# Patient Record
Sex: Male | Born: 1985 | Race: Black or African American | Hispanic: No | Marital: Single | Smoking: Current every day smoker
Health system: Southern US, Community
[De-identification: ages and names within clinical notes are randomized; demographics above are authoritative.]

## PROBLEM LIST (undated history)

## (undated) DIAGNOSIS — F909 Attention-deficit hyperactivity disorder, unspecified type: Secondary | ICD-10-CM

## (undated) DIAGNOSIS — B2790 Infectious mononucleosis, unspecified without complication: Secondary | ICD-10-CM

## (undated) DIAGNOSIS — L309 Dermatitis, unspecified: Secondary | ICD-10-CM

## (undated) DIAGNOSIS — E785 Hyperlipidemia, unspecified: Secondary | ICD-10-CM

## (undated) HISTORY — DX: Attention-deficit hyperactivity disorder, unspecified type: F90.9

## (undated) HISTORY — DX: Dermatitis, unspecified: L30.9

## (undated) HISTORY — DX: Hyperlipidemia, unspecified: E78.5

## (undated) HISTORY — DX: Infectious mononucleosis, unspecified without complication: B27.90

---
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
## 2008-10-13 ENCOUNTER — Encounter: Admission: RE | Admit: 2008-10-13 | Discharge: 2008-10-13 | Payer: Self-pay | Admitting: Cardiovascular Disease

## 2009-09-27 IMAGING — CR DG CHEST 2V
2 series · 2 of 2 positions shown · non-contrast
Comparison: None

CLINICAL DATA: Cough, asthma, smoker

CHEST - 2 VIEW

[w chest pa]
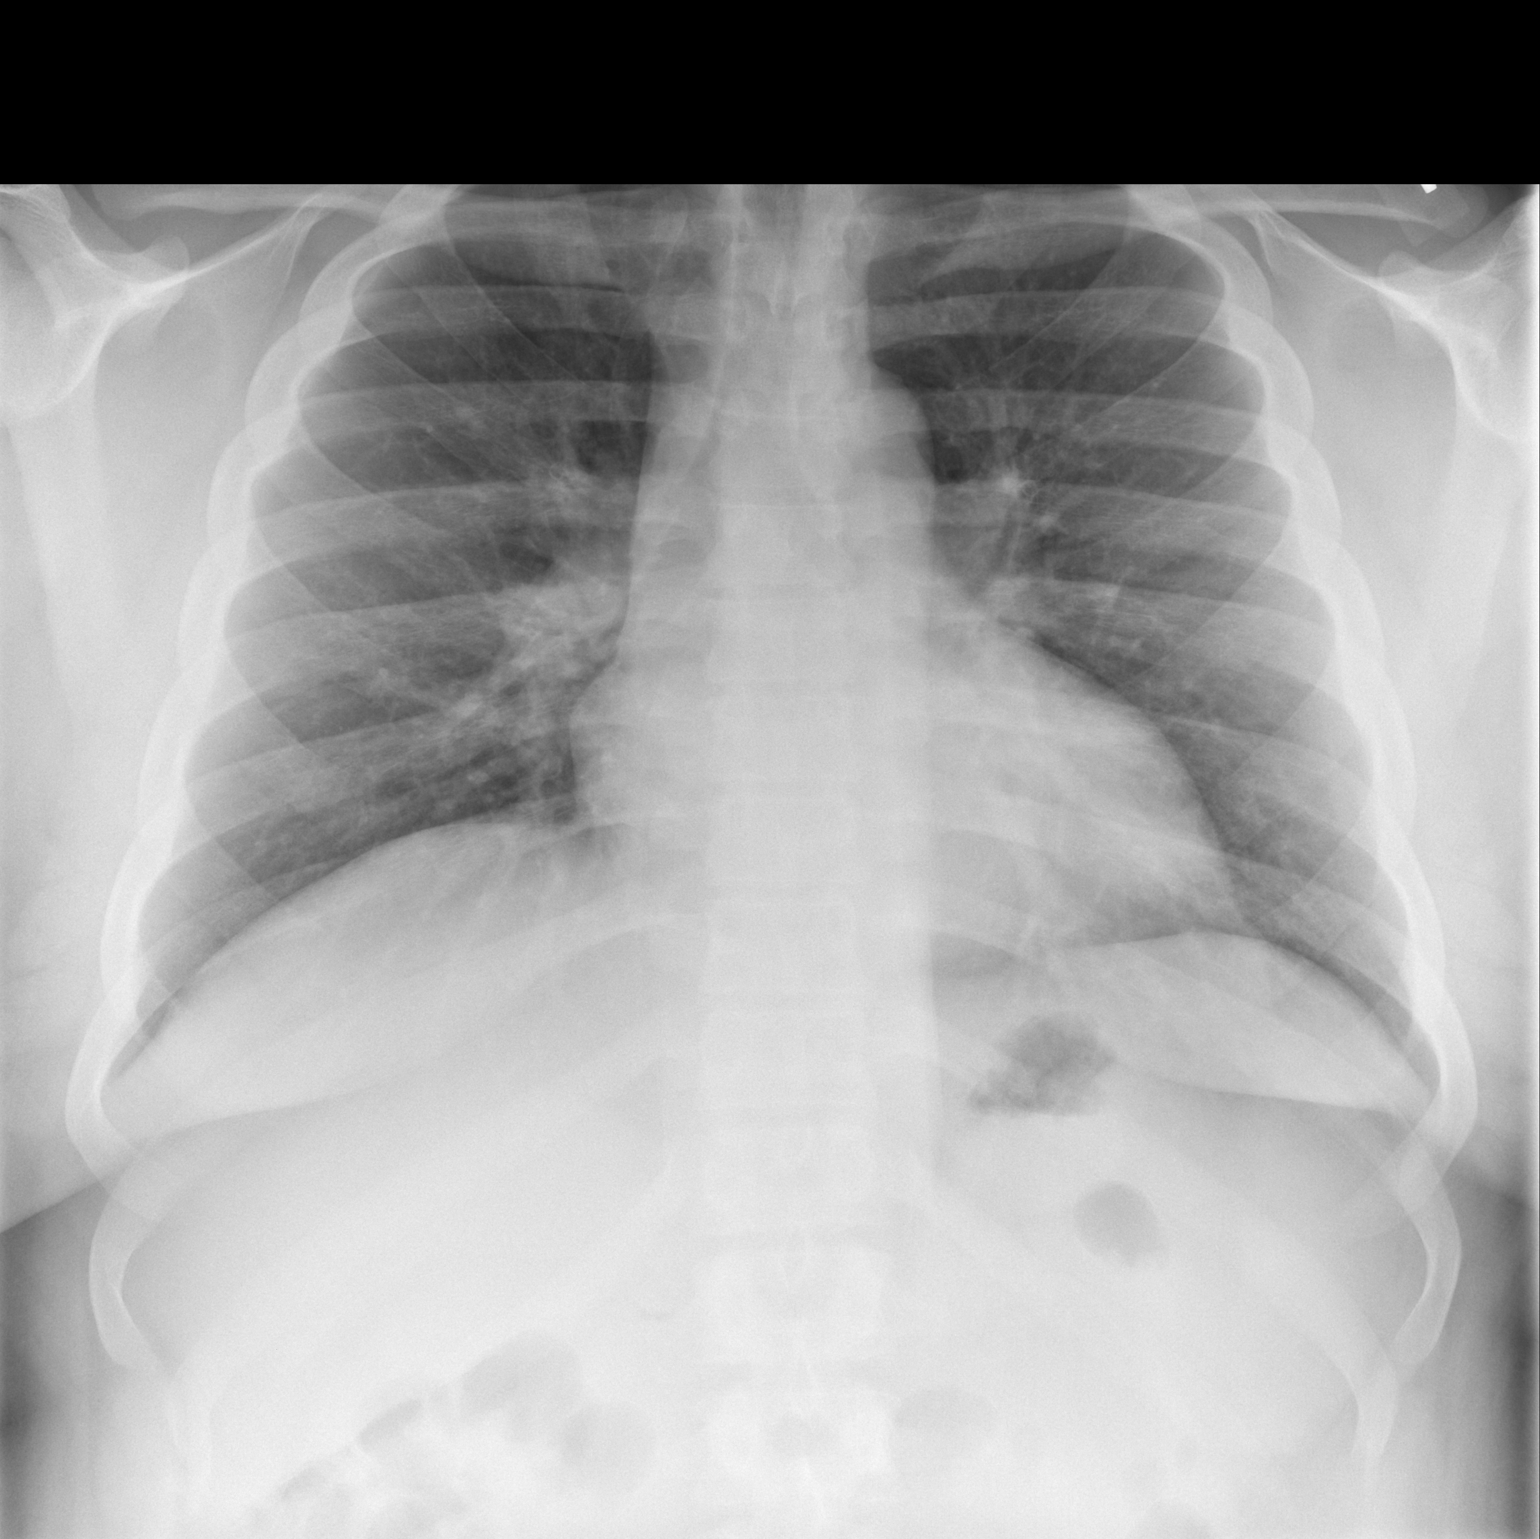

[w chest lat]
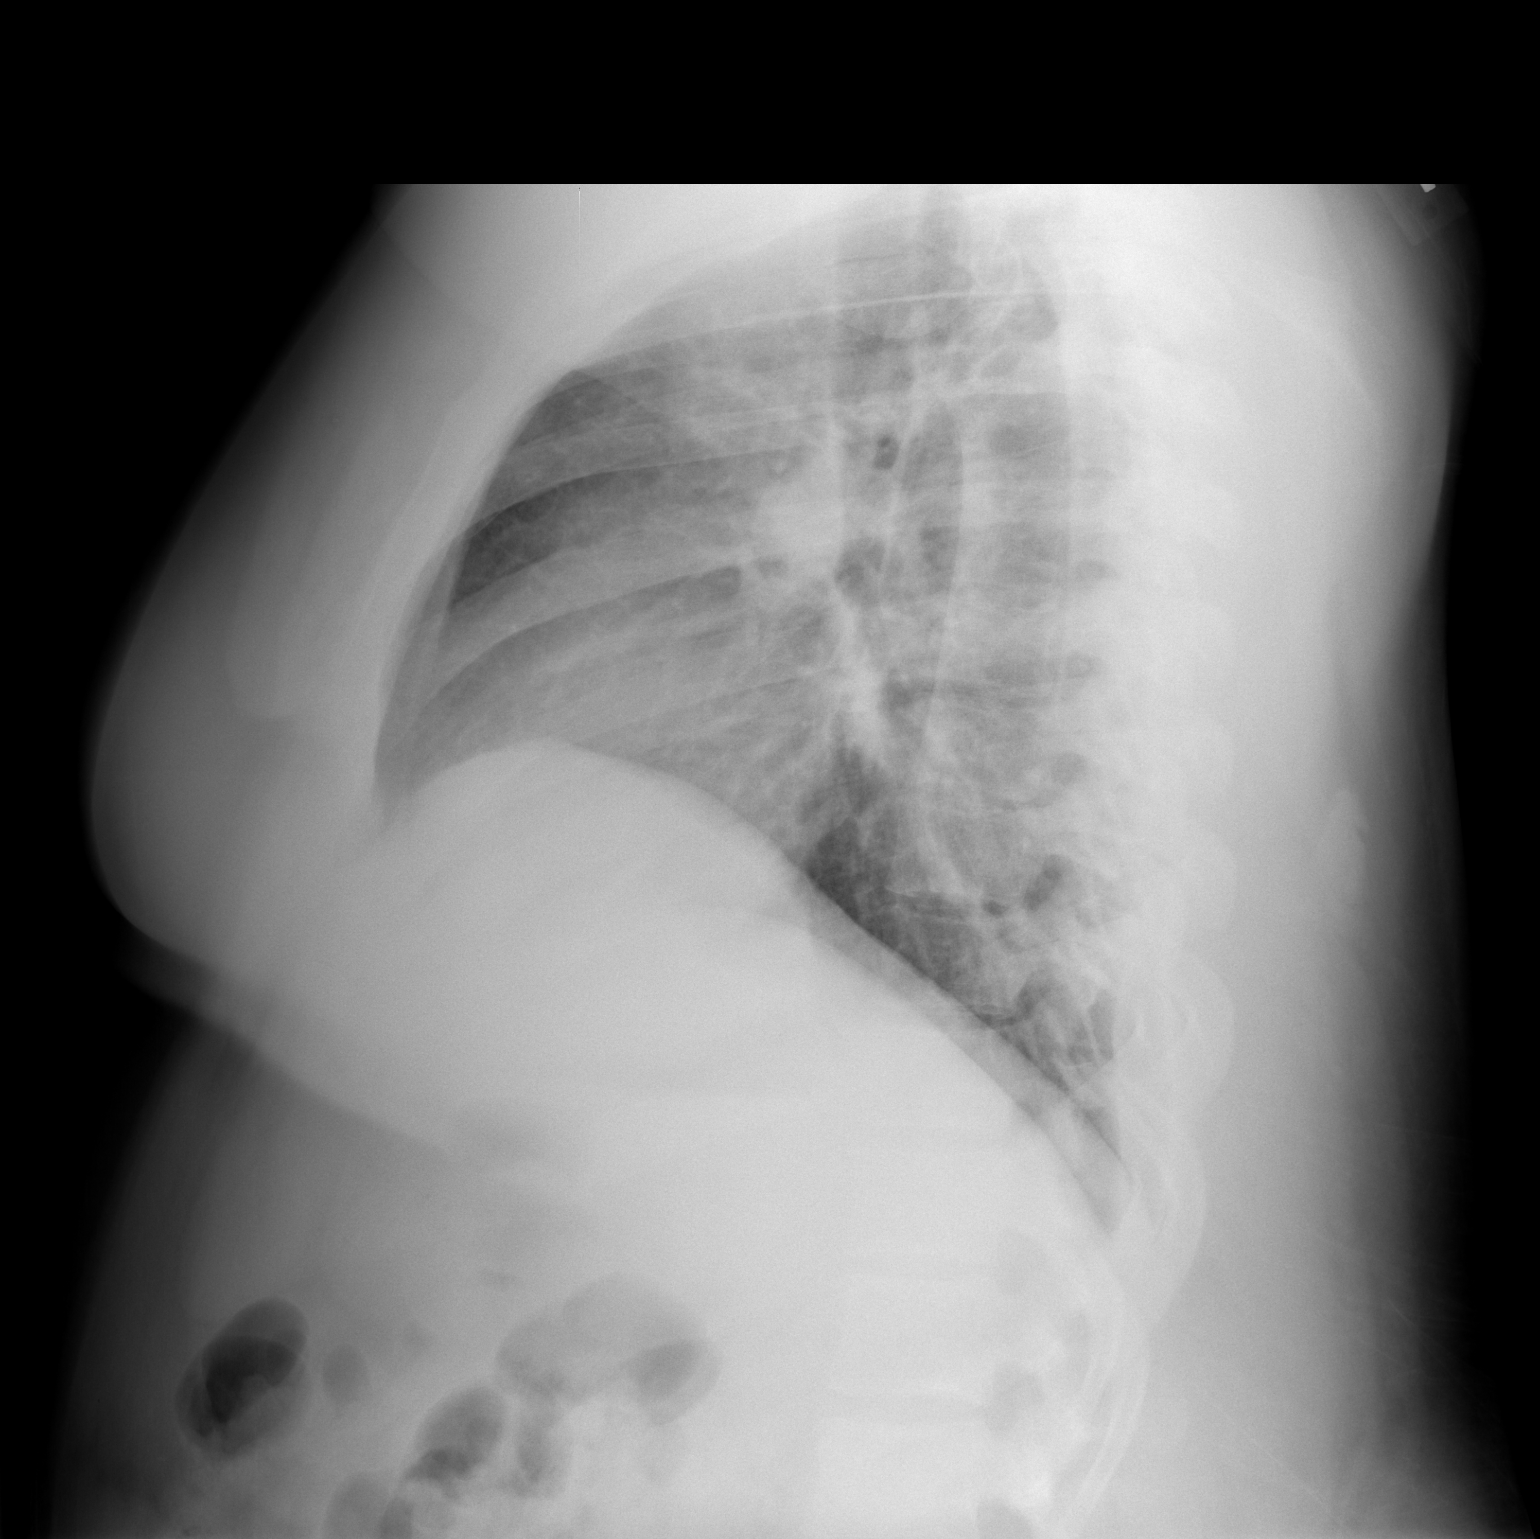

[2 of 2 positions shown; findings below may reference images not displayed]

FINDINGS: Borderline cardiac enlargement.
Normal mediastinal contours and pulmonary vascularity.
Peribronchial thickening without pulmonary infiltrate or pleural
effusion.
Bones unremarkable.
IMPRESSION: Borderline cardiac enlargement.
Mild bronchitic changes.

## 2011-04-03 ENCOUNTER — Other Ambulatory Visit: Payer: Self-pay

## 2011-04-03 DIAGNOSIS — Z111 Encounter for screening for respiratory tuberculosis: Secondary | ICD-10-CM

## 2011-04-03 MED ORDER — TUBERCULIN PPD 5 UNIT/0.1ML ID SOLN
5.0000 [IU] | Freq: Once | INTRADERMAL | Status: AC
  Administered 2011-04-03: 5 [IU] via INTRADERMAL

## 2011-04-04 ENCOUNTER — Encounter: Payer: Self-pay | Admitting: Family Medicine

## 2013-09-25 ENCOUNTER — Telehealth: Payer: Self-pay | Admitting: *Deleted

## 2013-09-25 ENCOUNTER — Other Ambulatory Visit: Payer: Self-pay

## 2013-09-25 DIAGNOSIS — E782 Mixed hyperlipidemia: Secondary | ICD-10-CM

## 2013-09-25 DIAGNOSIS — R011 Cardiac murmur, unspecified: Secondary | ICD-10-CM

## 2013-09-25 DIAGNOSIS — I10 Essential (primary) hypertension: Secondary | ICD-10-CM

## 2013-09-25 MED ORDER — AMLODIPINE BESYLATE 10 MG PO TABS: 10.0000 mg | ORAL_TABLET | Freq: Every day | ORAL | Status: DC

## 2013-09-25 NOTE — Telephone Encounter (Signed)
Rx was sent to pharmacy electronically. 

## 2013-09-25 NOTE — Telephone Encounter (Signed)
Spoke with Dr. Tresa EndoKelly in reference to patient's blood pressure being elevated 170/140. Patient had stopped his benicar for at least 1-1.5 years. On no therapy. Currently having no symptoms. Blood pressure was randomly checked. Patient unable to get into office right now due to scheduling conflicts. Per Dr. Tresa EndoKelly does not recommend he restart the Benicar due to not knowing patient's renal functions. Recommended patient to start amlodipine 10mg . Order blood work. Monitor blood pressure. Schedule appointment within the next few weeks.

## 2014-12-07 ENCOUNTER — Other Ambulatory Visit: Payer: Self-pay | Admitting: *Deleted

## 2014-12-07 MED ORDER — AMLODIPINE BESYLATE 10 MG PO TABS: 10.0000 mg | ORAL_TABLET | Freq: Every day | ORAL | Status: DC

## 2015-01-06 ENCOUNTER — Encounter: Payer: Self-pay | Admitting: *Deleted

## 2015-02-02 ENCOUNTER — Encounter: Payer: Self-pay | Admitting: Cardiovascular Disease

## 2016-01-19 MED FILL — HYDROXYZINE PAM 25 MG CAP: 25 | 25 days supply | Qty: 50 | Fill #0

## 2016-01-19 MED FILL — FLUTICASONE PROP 0.05% CRM: 0.05 | 20 days supply | Qty: 60 | Fill #0

## 2016-01-19 MED FILL — TRIAMCINOLONE 0.1% CREAM: 0.1 | 30 days supply | Qty: 454 | Fill #0

## 2016-01-19 MED FILL — MOMETASONE FUROATE 0.1% OIN: 0.1 | 20 days supply | Qty: 45 | Fill #0

## 2016-03-15 MED FILL — MOMETASONE FUROATE 0.1% OIN: 0.1 | 20 days supply | Qty: 45 | Fill #1

## 2016-03-15 MED FILL — TRIAMCINOLONE 0.1% CREAM: 0.1 | 30 days supply | Qty: 454 | Fill #1

## 2016-06-05 MED FILL — MOMETASONE FUROATE 0.1% OIN: 0.1 | 20 days supply | Qty: 45 | Fill #2

## 2016-08-25 MED FILL — MOMETASONE FUROATE 0.1% OIN: 0.1 | 20 days supply | Qty: 45 | Fill #3

## 2016-09-08 MED FILL — MOMETASONE FUROATE 0.1% OIN: 0.1 | 20 days supply | Qty: 45 | Fill #0

## 2016-11-16 MED FILL — MOMETASONE FUROATE 0.1% OIN: 0.1 | 20 days supply | Qty: 45 | Fill #1

## 2016-12-07 MED FILL — TRIAMCINOLONE 0.1% CREAM: 0.1 | 30 days supply | Qty: 454 | Fill #2

## 2016-12-07 MED FILL — MOMETASONE FUROATE 0.1% OIN: 0.1 | 20 days supply | Qty: 45 | Fill #2

## 2017-01-01 MED FILL — MOMETASONE FUROATE 0.1% OIN: 0.1 | 20 days supply | Qty: 45 | Fill #3

## 2017-03-12 MED FILL — CHLORHEXIDINE 0.12% RINSE: 0.12 | 7 days supply | Qty: 473 | Fill #0

## 2017-03-12 MED FILL — AMOX-CLAV 500-125 MG TABLET: 500-125 | 10 days supply | Qty: 30 | Fill #0

## 2017-03-12 MED FILL — SULFAMETHOXAZOLE-TMP DS TAB: 800-160 | 14 days supply | Qty: 28 | Fill #0

## 2017-03-12 MED FILL — AMLODIPINE BESYLATE 5 MG TA: 5 | 30 days supply | Qty: 30 | Fill #0

## 2019-08-06 ENCOUNTER — Encounter (INDEPENDENT_AMBULATORY_CARE_PROVIDER_SITE_OTHER): Payer: Self-pay

## 2019-08-06 ENCOUNTER — Other Ambulatory Visit: Payer: Self-pay

## 2019-08-06 ENCOUNTER — Ambulatory Visit (INDEPENDENT_AMBULATORY_CARE_PROVIDER_SITE_OTHER): Payer: BC Managed Care – PPO | Admitting: Cardiovascular Disease

## 2019-08-06 ENCOUNTER — Encounter: Payer: Self-pay | Admitting: Cardiovascular Disease

## 2019-08-06 DIAGNOSIS — I119 Hypertensive heart disease without heart failure: Secondary | ICD-10-CM

## 2019-08-06 DIAGNOSIS — G4733 Obstructive sleep apnea (adult) (pediatric): Secondary | ICD-10-CM

## 2019-08-06 DIAGNOSIS — I10 Essential (primary) hypertension: Secondary | ICD-10-CM

## 2019-08-06 MED ORDER — AMLODIPINE BESYLATE 10 MG PO TABS: 10.0000 mg | ORAL_TABLET | Freq: Every day | ORAL | 3 refills | Status: DC

## 2019-08-06 MED ORDER — METOPROLOL SUCCINATE ER 50 MG PO TB24: 50.0000 mg | ORAL_TABLET | Freq: Every day | ORAL | 1 refills | Status: DC

## 2019-08-06 NOTE — Progress Notes (Signed)
Cardiology Office Note    Date:  08/06/2019   ID:  ORESTE MAJEED, DOB 02-Jan-1986, MRN [id number]  PCP:  Ronnald Nian, MD  Cardiologist:  Nicki Guadalajara, MD   Re-establishment of cardiology care.  History of Present Illness:  ANTWOINE ZORN is a 34 y.o. male who I had seen many years ago he is in heart and vascular center.  I had initially seen him in February 2006.  He has a history of hypertension and obesity.  Mole he had been on Cardizem of therapy from almost a year prior to his initial evaluation with me.  When I first saw him in 2006 he weighed 275 pounds.  In 2008 he was 305 pounds.  An echo Doppler study done in 2008 showed mild to moderate left ventricular hypertrophy with wall thickness of 1.4 cm and left atrial dimension at 4.1 cm.  I last saw him in March 2010 and his weight was 341 pounds.  At that time he was on Advair twice daily for intermittent asthma.  During his last evaluation in March 2010 I was concerned about obstructive sleep apnea.  He had a poor diet with significant fast food intake as well as sweets.  I recommended he undergo a diagnostic polysomnogram and also provided him with samples of olmesartan to initiate blood pressure lowering medication.  I have not seen him over the past 11 years.  Apparently, at one point he may have been given a prescription by Dr. Algie Coffer for amlodipine but apparently stopped therapy.  He states his weight peaked at 380 pounds approximately over the past 3 years he had lost a significant amount of weight when he was doing significant amount of exercise.  He had been off blood pressure medications for over several years.  Blood pressure in the past had risen to 200/120.  He resume meds but apparently has been off medications for the past year.  With recent documentation of significant blood pressure elevation he resumed taking amlodipine for the past month at 5 mg.  He does exercise but not as much as he had in the past.  He currently  works as a Art therapist at [employer].  His sleep is poor.  Typically he goes to bed at midnight but may be up after 3 or 4 hours of sleep.  He admits to snoring.  He does not fall asleep during the day.  There is significant family history for obstructive sleep apnea in both his mother and older brother.  He never pursued my previous recommendation to have a sleep study over 10 years ago.  He has been trying to improve his diet and has not been eating fast foods as much as he had remotely.  Due to recent blood pressure elevation he now presents for reevaluation.   Past Medical History:  Diagnosis Date  . ADHD (attention deficit hyperactivity disorder)   . Asthma   . Dyslipidemia    MILD  . Eczema 1992  . Mononucleosis 05/02/2001     Current Medications: Outpatient Medications Prior to Visit  Medication Sig Dispense Refill  . amLODipine (NORVASC) 10 MG tablet Take 1 tablet (10 mg total) by mouth daily. (Patient taking differently: Take 5 mg by mouth daily. ) 30 tablet 6  . mometasone (ELOCON) 0.1 % ointment Apply topically as needed.    . diltiazem (CARDIZEM LA) 180 MG 24 hr tablet Take 180 mg by mouth daily.    . Fluticasone-Salmeterol (ADVAIR) 250-50 MCG/DOSE AEPB  Inhale 1 puff into the lungs as needed.    Marland Kitchen olmesartan (BENICAR) 20 MG tablet Take 20 mg by mouth daily.     No facility-administered medications prior to visit.     Allergies:   Peanuts [peanut oil]   Social History   Socioeconomic History  . Marital status: Single    Spouse name: Not on file  . Number of children: Not on file  . Years of education: Not on file  . Highest education level: Not on file  Occupational History  . Not on file  Tobacco Use  . Smoking status: Current Every Day Smoker  . Smokeless tobacco: Never Used  Substance and Sexual Activity  . Alcohol use: No  . Drug use: Never  . Sexual activity: Not on file  Other Topics Concern  . Not on file  Social History Narrative  . Not on file     Social Determinants of Health   Financial Resource Strain:   . Difficulty of Paying Living Expenses: Not on file  Food Insecurity:   . Worried About Programme researcher, broadcasting/film/video in the Last Year: Not on file  . Ran Out of Food in the Last Year: Not on file  Transportation Needs:   . Lack of Transportation (Medical): Not on file  . Lack of Transportation (Non-Medical): Not on file  Physical Activity:   . Days of Exercise per Week: Not on file  . Minutes of Exercise per Session: Not on file  Stress:   . Feeling of Stress : Not on file  Social Connections:   . Frequency of Communication with Friends and Family: Not on file  . Frequency of Social Gatherings with Friends and Family: Not on file  . Attends Religious Services: Not on file  . Active Member of Clubs or Organizations: Not on file  . Attends Banker Meetings: Not on file  . Marital Status: Not on file     Social history is that he is married since 2014.  He has a daughter who is now 40 from a previous girlfriend and a son age 55 with his current wife.  He is the son of our sleep nurse coordinator, Claiborne Rigg.  He currently is Art therapist at [employer].  Family History:  The patient's family history includes CAD in his paternal grandfather and paternal grandmother; COPD in his maternal grandfather; Diabetes in his paternal grandfather and paternal grandmother; Emphysema in his maternal grandfather; Fibromyalgia in his mother; Glaucoma in his maternal grandmother; Hyperlipidemia in his father and maternal grandmother; Hypertension in his maternal grandfather, maternal grandmother, mother, paternal grandfather, and paternal grandmother; Renal Disease in his paternal grandmother; Sarcoidosis in his maternal grandfather.   ROS General: Negative; No fevers, chills, or night sweats; morbid obesity HEENT: Negative; No changes in vision or hearing, sinus congestion, difficulty swallowing Pulmonary: Negative; No cough,  wheezing, shortness of breath, hemoptysis Cardiovascular: Negative; No chest pain, presyncope, syncope, palpitations GI: Negative; No nausea, vomiting, diarrhea, or abdominal pain GU: Negative; No dysuria, hematuria, or difficulty voiding Musculoskeletal: Negative; no myalgias, joint pain, or weakness Hematologic/Oncology: Negative; no easy bruising, bleeding Endocrine: Negative; no heat/cold intolerance; no diabetes Neuro: Negative; no changes in balance, headaches Skin: Negative; No rashes or skin lesions Psychiatric: Negative; No behavioral problems, depression Sleep: Positive for snoring, nonrestorative sleep; negative; no bruxism, restless legs, hypnogognic hallucinations, no cataplexy Other comprehensive 14 point system review is negative.   PHYSICAL EXAM:   VS:  BP (!) 169/113  Pulse (!) 108   Temp 99.1 F (37.3 C)   Ht 6\' 1"  (1.854 m)   Wt (!) 357 lb 3.2 oz (162 kg)   SpO2 98%   BMI 47.13 kg/m     Repeat blood pressure by me was 166/120.  Repeat was 170/118  Wt Readings from Last 3 Encounters:  08/06/19 (!) 357 lb 3.2 oz (162 kg)    General: Alert, oriented, no distress.  Skin: normal turgor, no rashes, warm and dry HEENT: Normocephalic, atraumatic. Pupils equal round and reactive to light; sclera anicteric; extraocular muscles intact;  Nose without nasal septal hypertrophy Mouth/Parynx benign; Mallinpatti scale 4 Neck: No JVD, no carotid bruits; normal carotid upstroke Lungs: clear to ausculatation and percussion; no wheezing or rales Chest wall: without tenderness to palpitation Heart: PMI not displaced, tachycardic at 105, s1 s2 normal, 1/6 systolic murmur, no diastolic murmur, no rubs, gallops, thrills, or heaves Abdomen: Central adiposity; soft, nontender; no hepatosplenomehaly, BS+; abdominal aorta nontender and not dilated by palpation. Back: no CVA tenderness Pulses 2+ Musculoskeletal: full range of motion, normal strength, no joint deformities Extremities:  no clubbing cyanosis or edema, Homan's sign negative  Neurologic: grossly nonfocal; Cranial nerves grossly wnl Psychologic: Normal mood and affect   Studies/Labs Reviewed:   EKG:  EKG is ordered today.  ECG (independently read by me): Sinus Tachycardia at 105; LAE, nonspecific T changes; QTc 452 msec  Recent Labs: No flowsheet data found.   No flowsheet data found.  No flowsheet data found. No results found for: MCV No results found for: TSH No results found for: HGBA1C   BNP No results found for: BNP  ProBNP No results found for: PROBNP   Lipid Panel  No results found for: CHOL, TRIG, HDL, CHOLHDL, VLDL, LDLCALC, LDLDIRECT, LABVLDL   RADIOLOGY: No results found.   Additional studies/ records that were reviewed today include:  I was able to obtain a prior office visit from the [facility] vascular [facility] dated August 25, 2008.  ASSESSMENT:    No diagnosis found.   PLAN:  Mr. Sandra Tellefsen is a 34 year old has a history of documented prior hypertension dating back to at least 2006.  Remotely had been on Cardizem and when seen at that time had been off therapy for 6 months.  He has had significant weight gain up to a maximum of 380 pounds but over the last several years had lost a significant amount, unfortunately to regain proximately 50 pounds back.  Current weight today is 357 pounds and his body mass index is 47.13.  He has stage II accelerated hypertension.  I had a long discussion with him today regarding his poorly controlled blood pressure.  He had been off therapy for almost a year but recently has restarted taking amlodipine 5 mg from her prior prescription which may have been given by Dr. 2007.  I discussed the ramifications of significant blood pressure control regarding cardiovascular risk including significant stroke risk, kidney insufficiency, as well as hypertensive heart disease with potential CHF.  Demanding he undergo an echo Doppler study  for evaluation.  Laboratory will be checked today consisting of a comprehensive metabolic panel, TSH, CBC, lipid panel, hemoglobin A1c, as well as BNP.  Recommended increasing amlodipine to 10 mg.  Heart rate is increased with sinus tachycardia presently at 105 bpm.  I am initiating metoprolol succinate 50 mg daily.  I discussed a definitive sleep evaluation I suspicion for significant obstructive sleep apnea which may be contributing to his  event blood pressure elevation particularly if sleep apnea is severe during REM sleep.  I discussed the importance of weight loss, proper diet, and increasing exercise.  I will contact him regarding the results of laboratory once available and adjustments may be necessary to his medical regimen.  I commended that he see our pharmacist in 2 weeks in follow-up of his accelerated hypertension with initial medication management with additional medication adjustment to be done at that time and I plan to see him in 4 to 6 weeks for follow-up evaluation.   Medication Adjustments/Labs and Tests Ordered: Current medicines are reviewed at length with the patient today.  Concerns regarding medicines are outlined above.  Medication changes, Labs and Tests ordered today are listed in the Patient Instructions below. There are no Patient Instructions on file for this visit.   Signed, Shelva Majestic, MD  08/06/2019 4:16 PM    [facility] [address], [place], [place], [place]  [address] Phone: [phone]

## 2019-08-06 NOTE — Patient Instructions (Signed)
Medication Instructions:  BEGIN TAKING AMLODIPINE 10MG  DAILY (1 TABLET DAILY) BEGIN TAKING METOPROLOL SUCCINATE 50MG  DAILY (1 TABLET DAILY) *If you need a refill on your cardiac medications before your next appointment, please call your pharmacy*  Lab Work: TOMORROW FASTING: CMET, TSH, BNP, LIPID, HGBA1C If you have labs (blood work) drawn today and your tests are completely normal, you will receive your results only by: MyChart Message (if you have MyChart) OR . A paper copy in the mail If you have any lab test that is abnormal or we need to change your treatment, we will call you to review the results.  Testing/Procedures: Your physician has requested that you have an echocardiogram. Echocardiography is a painless test that uses sound waves to create images of your heart. It provides your doctor with information about the size and shape of your heart and how well your heart's chambers and valves are working. This procedure takes approximately one hour. There are no restrictions for this procedure.  [address]  Your physician has recommended that you have a sleep study. This test records several body functions during sleep, including: brain activity, eye movement, oxygen and carbon dioxide blood levels, heart rate and rhythm, breathing rate and rhythm, the flow of air through your mouth and nose, snoring, body muscle movements, and chest and belly movement.   Follow-Up: At [facility], you and your health needs are our priority.  As part of our continuing mission to provide you with exceptional heart care, we have created designated Provider Care Teams.  These Care Teams include your primary Cardiologist (physician) and Advanced Practice Providers (APPs -  Physician Assistants and Nurse Practitioners) who all work together to provide you with the care you need, when you need it.  Your next appointment:   4-6 week(s)  The format for your next appointment:   In  Person  Provider:   Marland Kitchen, MD  Other Instructions FOLLOW UP WITH KRISTIN OR RAQUEL IN 2 WEEKS

## 2019-08-07 ENCOUNTER — Other Ambulatory Visit: Payer: Self-pay | Admitting: *Deleted

## 2019-08-07 MED ORDER — MOMETASONE FUROATE 0.1 % EX OINT: TOPICAL_OINTMENT | CUTANEOUS | 5 refills | Status: AC | PRN

## 2019-08-07 NOTE — Telephone Encounter (Signed)
Rx has been sent to the pharmacy electronically. ° °

## 2019-08-08 LAB — SPECIMEN STATUS REPORT

## 2019-08-11 ENCOUNTER — Encounter: Payer: Self-pay | Admitting: Cardiovascular Disease

## 2019-08-13 ENCOUNTER — Other Ambulatory Visit: Payer: Self-pay | Admitting: *Deleted

## 2019-08-13 DIAGNOSIS — I119 Hypertensive heart disease without heart failure: Secondary | ICD-10-CM

## 2019-08-13 DIAGNOSIS — I10 Essential (primary) hypertension: Secondary | ICD-10-CM

## 2019-08-13 NOTE — Progress Notes (Signed)
Reorder labs , the initial order not processed.

## 2019-08-14 LAB — LIPID PANEL
Chol/HDL Ratio: 6.6 ratio — ABNORMAL HIGH (ref 0.0–5.0)
Cholesterol, Total: 238 mg/dL — ABNORMAL HIGH (ref 100–199)
HDL: 36 mg/dL — ABNORMAL LOW (ref >39)
LDL Chol Calc (NIH): 176 mg/dL — ABNORMAL HIGH (ref 0–99)
Triglycerides: 143 mg/dL (ref 0–149)
VLDL Cholesterol Cal: 26 mg/dL (ref 5–40)

## 2019-08-14 LAB — COMPREHENSIVE METABOLIC PANEL
ALT: 21 IU/L (ref 0–44)
AST: 20 IU/L (ref 0–40)
Albumin/Globulin Ratio: 1.3 (ref 1.2–2.2)
Albumin: 4.3 g/dL (ref 4.0–5.0)
Alkaline Phosphatase: 64 IU/L (ref 39–117)
BUN/Creatinine Ratio: 11 (ref 9–20)
BUN: 11 mg/dL (ref 6–20)
Bilirubin Total: 0.4 mg/dL (ref 0.0–1.2)
CO2: 22 mmol/L (ref 20–29)
Calcium: 8.9 mg/dL (ref 8.7–10.2)
Chloride: 105 mmol/L (ref 96–106)
Creatinine, Ser: 1.02 mg/dL (ref 0.76–1.27)
GFR calc Af Amer: 112 mL/min/{1.73_m2} (ref >59)
GFR calc non Af Amer: 97 mL/min/{1.73_m2} (ref >59)
Globulin, Total: 3.3 g/dL (ref 1.5–4.5)
Glucose: 76 mg/dL (ref 65–99)
Potassium: 4.4 mmol/L (ref 3.5–5.2)
Sodium: 140 mmol/L (ref 134–144)
Total Protein: 7.6 g/dL (ref 6.0–8.5)

## 2019-08-14 LAB — CBC
Hematocrit: 46.8 % (ref 37.5–51.0)
Hemoglobin: 16.3 g/dL (ref 13.0–17.7)
MCH: 29 pg (ref 26.6–33.0)
MCHC: 34.8 g/dL (ref 31.5–35.7)
MCV: 83 fL (ref 79–97)
Platelets: 310 10*3/uL (ref 150–450)
RBC: 5.62 x10E6/uL (ref 4.14–5.80)
RDW: 12.7 % (ref 11.6–15.4)
WBC: 9.7 10*3/uL (ref 3.4–10.8)

## 2019-08-14 LAB — HEMOGLOBIN A1C
Est. average glucose Bld gHb Est-mCnc: 114 mg/dL
Hgb A1c MFr Bld: 5.6 % (ref 4.8–5.6)

## 2019-08-14 LAB — TSH: TSH: 1.04 u[IU]/mL (ref 0.450–4.500)

## 2019-08-14 LAB — BRAIN NATRIURETIC PEPTIDE: BNP: <2.5 pg/mL (ref 0.0–100.0)

## 2019-08-19 ENCOUNTER — Other Ambulatory Visit: Payer: Self-pay | Admitting: Cardiovascular Disease

## 2019-08-19 DIAGNOSIS — I10 Essential (primary) hypertension: Secondary | ICD-10-CM

## 2019-08-19 DIAGNOSIS — R0683 Snoring: Secondary | ICD-10-CM

## 2019-08-20 ENCOUNTER — Other Ambulatory Visit (HOSPITAL_COMMUNITY): Payer: Self-pay

## 2019-08-21 ENCOUNTER — Other Ambulatory Visit: Payer: Self-pay | Admitting: Cardiovascular Disease

## 2019-08-21 MED ORDER — ROSUVASTATIN CALCIUM 20 MG PO TABS: 20.0000 mg | ORAL_TABLET | Freq: Every day | ORAL | 3 refills | Status: DC

## 2019-08-22 ENCOUNTER — Other Ambulatory Visit (HOSPITAL_COMMUNITY): Payer: BC Managed Care – PPO

## 2019-08-25 ENCOUNTER — Encounter (HOSPITAL_BASED_OUTPATIENT_CLINIC_OR_DEPARTMENT_OTHER): Payer: BC Managed Care – PPO | Admitting: Cardiovascular Disease

## 2019-08-28 ENCOUNTER — Ambulatory Visit (INDEPENDENT_AMBULATORY_CARE_PROVIDER_SITE_OTHER): Payer: BC Managed Care – PPO | Admitting: Pharmacist Clinician (PhC)/ Clinical Pharmacy Specialist

## 2019-08-28 ENCOUNTER — Other Ambulatory Visit: Payer: Self-pay

## 2019-08-28 DIAGNOSIS — I10 Essential (primary) hypertension: Secondary | ICD-10-CM | POA: Diagnosis not present

## 2019-08-28 NOTE — Patient Instructions (Addendum)
Return for a a follow up appointment in 1 month  Your blood pressure today is 132/88  Check your blood pressure at home as able and keep record of the readings.  Also record your heart rates daily  Take your BP meds as follows:  Increase your metoprolol to 1.5 tablets (75 mg) daily.   Continue other medications  Bring all of your meds, your BP cuff and your record of home blood pressures to your next appointment.  Exercise as you're able, try to walk approximately 30 minutes per day.  Keep salt intake to a minimum, especially watch canned and prepared boxed foods.  Eat more fresh fruits and vegetables and fewer canned items.  Avoid eating in fast food restaurants.    HOW TO TAKE YOUR BLOOD PRESSURE: . Rest 5 minutes before taking your blood pressure. .  Don't smoke or drink caffeinated beverages for at least 30 minutes before. . Take your blood pressure before (not after) you eat. . Sit comfortably with your back supported and both feet on the floor (don't cross your legs). . Elevate your arm to heart level on a table or a desk. . Use the proper sized cuff. It should fit smoothly and snugly around your bare upper arm. There should be enough room to slip a fingertip under the cuff. The bottom edge of the cuff should be 1 inch above the crease of the elbow. . Ideally, take 3 measurements at one sitting and record the average.

## 2019-08-28 NOTE — Progress Notes (Signed)
08/31/2019 Dalton Carter 1985/08/29 [id number]   HPI:  Dalton Carter is a 34 y.o. male patient of Dr Tresa Endo, with a PMH below who presents today for hypertension clinic evaluation.  He was seen by Dr. Tresa Endo in February and found to have a BP of 169/113, HR of 108 and LDL cholesterol of 176.  Other lab work was WNL.   He had not taken any medications for some time.  Dr. Tresa Endo started hom on amlodipine 10 mg and metoprolol succ 50 mg both once daily.    Patient returns today for follow up.  He states having been compliant with his medications and has no concerns for side effects or other issues.  Since his last appointment he has mostly quit eating at work (he manages a [employer]) and has started back to the gym.      Blood Pressure Goal:  130/80  Current Medications: amlodipine 10 mg, metoprolol succ 50 mg   Family Hx:  Mother and father both with hypertension; 57, 37;  Daughter 4, son 5  Social Hx:  somkes 1.5 ppw; social alcohol;   Diet:  Drinking gallon of water per day 3-4 times per week; was previously drinking soda - had some caffeine withdrawal but feeling better now.  Quit eating at work [employer]), although still eating out - using [employer] - likes Special educational needs teacher - likes cold cut [place] pita or chicken pita; brown rice.    Exercise:  Started back to gym, treadmill 3 miles per day; previously played football and baseball; walk dogs daily   Home BP readings: no home checks, but can go to mother's house (she is our Sleep Study coordinator)  HR average 87 per [employer], with exercise to 104  Intolerances: peanuts  Labs:  2/21:  Na 140, K 4.4, Glu 76, BUN 11, SCr 1.02 GFR 112   TC 238, TG 143, HDL 36, LDL 176 (no meds)  Wt Readings from Last 3 Encounters:  08/28/19 (!) 354 lb 3.2 oz (160.7 kg)  08/06/19 (!) 357 lb 3.2 oz (162 kg)   BP Readings from Last 3 Encounters:  08/28/19 132/88  08/06/19 (!) 169/113   Pulse Readings from Last 3 Encounters:    08/28/19 (!) 103  08/06/19 (!) 108    Current Outpatient Medications  Medication Sig Dispense Refill  . amLODipine (NORVASC) 10 MG tablet Take 1 tablet (10 mg total) by mouth daily. 90 tablet 3  . metoprolol succinate (TOPROL XL) 50 MG 24 hr tablet Take 1 tablet (50 mg total) by mouth daily. Take with or immediately following a meal. 30 tablet 1  . mometasone (ELOCON) 0.1 % ointment Apply topically as needed. 45 g 5  . rosuvastatin (CRESTOR) 20 MG tablet Take 1 tablet (20 mg total) by mouth daily. (Patient not taking: Reported on 08/28/2019) 90 tablet 3   No current facility-administered medications for this visit.    Allergies  Allergen Reactions  . Peanuts [Peanut Oil]     Patient stated he is allergic to peanuts and peanut products but there is not an indication what the reaction or severity or onset date.    Past Medical History:  Diagnosis Date  . ADHD (attention deficit hyperactivity disorder)   . Asthma   . Dyslipidemia    MILD  . Eczema 1992  . Mononucleosis 05/02/2001    Blood pressure 132/88, pulse (!) 103, resp. rate 15, height 6\' 1"  (1.854 m), weight (!) 354 lb 3.2 oz (  160.7 kg), SpO2 96 %.  Hypertension Patient with mixed systolic/diastolic hypertension, now doing much better with amlodipine 10 and metoprolol 50.  He has made great strides to work on changing his diet and we had a long discussion about doing food prep at home to avoid eating out at even "healthier" restaurants.  He was encouraged to continue with this as well as his exercise regimen to get into better shape.  Because of his young age, we will monitor him closely to be sure his pressure stays controlled.  He will follow up in one month.     Tommy Medal PharmD CPP [facility] [address] [place], [place] [address] [phone]

## 2019-08-31 ENCOUNTER — Ambulatory Visit: Payer: BC Managed Care – PPO | Attending: Internal Medicine

## 2019-08-31 DIAGNOSIS — Z23 Encounter for immunization: Secondary | ICD-10-CM | POA: Insufficient documentation

## 2019-08-31 DIAGNOSIS — I10 Essential (primary) hypertension: Secondary | ICD-10-CM | POA: Insufficient documentation

## 2019-08-31 NOTE — Progress Notes (Signed)
   Covid-19 Vaccination Clinic  Name:  Dalton Carter    MRN: [id number] DOB: 11/05/1985  08/31/2019  [name] was observed post Covid-19 immunization for 15 minutes without incident. He was provided with Vaccine Information Sheet and instruction to access the V-Safe system.   [name] was instructed to call 911 with any severe reactions post vaccine: Marland Kitchen Difficulty breathing  . Swelling of face and throat  . A fast heartbeat  . A bad rash all over body  . Dizziness and weakness   Immunizations Administered    Name Date Dose VIS Date Route   Pfizer COVID-19 Vaccine 08/31/2019  4:21 PM 0.3 mL 06/06/2019 Intramuscular   Manufacturer: [employer], [employer]   Lot: [id number]   NDC: [address]-7

## 2019-08-31 NOTE — Assessment & Plan Note (Signed)
Patient with mixed systolic/diastolic hypertension, now doing much better with amlodipine 10 and metoprolol 50.  He has made great strides to work on changing his diet and we had a long discussion about doing food prep at home to avoid eating out at even "healthier" restaurants.  He was encouraged to continue with this as well as his exercise regimen to get into better shape.  Because of his young age, we will monitor him closely to be sure his pressure stays controlled.  He will follow up in one month.

## 2019-09-01 ENCOUNTER — Other Ambulatory Visit: Payer: Self-pay

## 2019-09-01 ENCOUNTER — Ambulatory Visit (HOSPITAL_COMMUNITY): Payer: BC Managed Care – PPO | Attending: Cardiology

## 2019-09-01 DIAGNOSIS — G4733 Obstructive sleep apnea (adult) (pediatric): Secondary | ICD-10-CM

## 2019-09-01 DIAGNOSIS — I10 Essential (primary) hypertension: Secondary | ICD-10-CM | POA: Insufficient documentation

## 2019-09-01 MED ORDER — PERFLUTREN LIPID MICROSPHERE
1.0000 mL | INTRAVENOUS | Status: AC | PRN
  Administered 2019-09-01 (×2): 2 mL via INTRAVENOUS

## 2019-09-11 ENCOUNTER — Other Ambulatory Visit: Payer: Self-pay

## 2019-09-11 ENCOUNTER — Ambulatory Visit (INDEPENDENT_AMBULATORY_CARE_PROVIDER_SITE_OTHER): Payer: BC Managed Care – PPO | Admitting: Cardiovascular Disease

## 2019-09-11 ENCOUNTER — Encounter: Payer: Self-pay | Admitting: Cardiovascular Disease

## 2019-09-11 DIAGNOSIS — I10 Essential (primary) hypertension: Secondary | ICD-10-CM | POA: Diagnosis not present

## 2019-09-11 DIAGNOSIS — G4733 Obstructive sleep apnea (adult) (pediatric): Secondary | ICD-10-CM | POA: Diagnosis not present

## 2019-09-11 DIAGNOSIS — E7800 Pure hypercholesterolemia, unspecified: Secondary | ICD-10-CM | POA: Diagnosis not present

## 2019-09-11 MED ORDER — AMLODIPINE BESYLATE 10 MG PO TABS: 10.0000 mg | ORAL_TABLET | Freq: Every day | ORAL | 3 refills | Status: DC

## 2019-09-11 MED ORDER — METOPROLOL SUCCINATE ER 100 MG PO TB24: 100.0000 mg | ORAL_TABLET | Freq: Every day | ORAL | 3 refills | Status: DC

## 2019-09-11 NOTE — Patient Instructions (Signed)
Medication Instructions:  INCREASE YOUR METOPROLOL TO 100MG  DAILY  *If you need a refill on your cardiac medications before your next appointment, please call your pharmacy*     Follow-Up: At [facility], you and your health needs are our priority.  As part of our continuing mission to provide you with exceptional heart care, we have created designated Provider Care Teams.  These Care Teams include your primary Cardiologist (physician) and Advanced Practice Providers (APPs -  Physician Assistants and Nurse Practitioners) who all work together to provide you with the care you need, when you need it.  We recommend signing up for the patient portal called "MyChart".  Sign up information is provided on this After Visit Summary.  MyChart is used to connect with patients for Virtual Visits (Telemedicine).  Patients are able to view lab/test results, encounter notes, upcoming appointments, etc.  Non-urgent messages can be sent to your provider as well.   To learn more about what you can do with MyChart, go to [facility].    Your next appointment:   3 month(s)  The format for your next appointment:   In Person  Provider:   [email or web address], MD

## 2019-09-11 NOTE — Progress Notes (Signed)
Cardiology Office Note    Date:  09/11/2019   ID:  Dalton Carter, DOB 05-27-1986, MRN [id number]  PCP:  Denita Lung, MD  Cardiologist:  Shelva Majestic, MD   Re-establishment of cardiology care.  History of Present Illness:  Dalton Carter is a 34 y.o. male who I had seen many years ago he is in heart and vascular center.  I had initially seen him in February 2006.  He has a history of hypertension and obesity.  Mole he had been on Cardizem of therapy from almost a year prior to his initial evaluation with me.  When I first saw him in 2006 he weighed 275 pounds.  In 2008 he was 305 pounds.  An echo Doppler study done in 2008 showed mild to moderate left ventricular hypertrophy with wall thickness of 1.4 cm and left atrial dimension at 4.1 cm.  I last saw him in March 2010 and his weight was 341 pounds.  At that time he was on Advair twice daily for intermittent asthma.  During his last evaluation in March 2010 I was concerned about obstructive sleep apnea.  He had a poor diet with significant fast food intake as well as sweets.  I recommended he undergo a diagnostic polysomnogram and also provided him with samples of olmesartan to initiate blood pressure lowering medication.  I have not seen him over the past 11 years.  Apparently, at one point he may have been given a prescription by Dr. Oswaldo Milian for amlodipine but apparently stopped therapy.  He states his weight peaked at 380 pounds approximately over the past 3 years he had lost a significant amount of weight when he was doing significant amount of exercise.  He had been off blood pressure medications for over several years.  Blood pressure in the past had risen to 200/120.  He resume meds but apparently has been off medications for the past year.  With recent documentation of significant blood pressure elevation he resumed taking amlodipine for the past month at 5 mg.  He does exercise but not as much as he had in the past.  He currently  works as a Health and safety inspector at [employer].  His sleep is poor.  Typically he goes to bed at midnight but may be up after 3 or 4 hours of sleep.  He admits to snoring.  He does not fall asleep during the day.  There is significant family history for obstructive sleep apnea in both his mother and older brother.  He never pursued my previous recommendation to have a sleep study over 10 years ago.  He has been trying to improve his diet and has not been eating fast foods as much as he had remotely.  Due to recent blood pressure elevation I saw him for reestablishment of cardiology care on August 06, 2019.  During my February evaluation, he had accelerated hypertension with blood pressure recorded by me at 166/120 and repeat 170/118.  He was tachycardic with a heart rate of 105.  For the previous month he was on amlodipine 5 mg and I recommended titration to 10 mg daily.  I also initiated Toprol-XL 50 mg.  I recommended he undergo a 2D echo Doppler study, laboratory and scheduled him for a follow-up visit in 2 weeks with Cyril Mourning our Pharm.D.  His echo Doppler study on September 01, 2019 showed an EF of 65 to 70% with mild concentric LVH.  He had normal diastolic parameters.  Laboratory was notable  for normal renal function and thyroid function studies as well as normal LFTs.  CBC was normal.  He had significant elevation of lipids with total cholesterol 238, triglycerides 143, HDL 36, LDL 176.  He saw Cyril Mourning for follow-up evaluation on August 28, 2019.  At that time blood pressure was improved but still elevated and she recommended further titration of Toprol-XL to 75 mg.   Since his follow-up evaluation he states there have been a couple of days where he admits to not taking his medications.  He apparently did not have a pill cutter and never increased the Toprol-XL.  In the office today I had a lengthy discussion with him regarding his daily diet.  He eats at least 5 days/week, often has meat every day if not  several times per day but typically has been trying to eat more chicken rather than red meat.  He is scheduled for a home sleep study for the end of April.  He presents for reevaluation.   Past Medical History:  Diagnosis Date  . ADHD (attention deficit hyperactivity disorder)   . Asthma   . Dyslipidemia    MILD  . Eczema 1992  . Mononucleosis 05/02/2001     Current Medications: Outpatient Medications Prior to Visit  Medication Sig Dispense Refill  . amLODipine (NORVASC) 10 MG tablet Take 1 tablet (10 mg total) by mouth daily. 90 tablet 3  . Metoprolol Succinate 50 MG CS24 Take 75 mg by mouth. Take a tablet and a half daily.    . mometasone (ELOCON) 0.1 % ointment Apply topically as needed. 45 g 5  . rosuvastatin (CRESTOR) 20 MG tablet Take 1 tablet (20 mg total) by mouth daily. 90 tablet 3  . metoprolol succinate (TOPROL XL) 50 MG 24 hr tablet Take 1 tablet (50 mg total) by mouth daily. Take with or immediately following a meal. 30 tablet 1   No facility-administered medications prior to visit.     Allergies:   Peanuts [peanut oil]   Social History   Socioeconomic History  . Marital status: Single    Spouse name: Not on file  . Number of children: Not on file  . Years of education: Not on file  . Highest education level: Not on file  Occupational History  . Not on file  Tobacco Use  . Smoking status: Current Every Day Smoker  . Smokeless tobacco: Never Used  Substance and Sexual Activity  . Alcohol use: No  . Drug use: Never  . Sexual activity: Not on file  Other Topics Concern  . Not on file  Social History Narrative  . Not on file   Social Determinants of Health   Financial Resource Strain:   . Difficulty of Paying Living Expenses:   Food Insecurity:   . Worried About Charity fundraiser in the Last Year:   . Arboriculturist in the Last Year:   Transportation Needs:   . Film/video editor (Medical):   Marland Kitchen Lack of Transportation (Non-Medical):     Physical Activity:   . Days of Exercise per Week:   . Minutes of Exercise per Session:   Stress:   . Feeling of Stress :   Social Connections:   . Frequency of Communication with Friends and Family:   . Frequency of Social Gatherings with Friends and Family:   . Attends Religious Services:   . Active Member of Clubs or Organizations:   . Attends Archivist Meetings:   .  Marital Status:      Social history is that he is married since 2014.  He has a daughter who is now 17 from a previous girlfriend and a son age 30 with his current wife.  He is the son of our sleep nurse coordinator, Barry Brunner.  He currently is Health and safety inspector at [employer].  Family History:  The patient's family history includes CAD in his paternal grandfather and paternal grandmother; COPD in his maternal grandfather; Diabetes in his paternal grandfather and paternal grandmother; Emphysema in his maternal grandfather; Fibromyalgia in his mother; Glaucoma in his maternal grandmother; Hyperlipidemia in his father and maternal grandmother; Hypertension in his maternal grandfather, maternal grandmother, mother, paternal grandfather, and paternal grandmother; Renal Disease in his paternal grandmother; Sarcoidosis in his maternal grandfather.   ROS General: Negative; No fevers, chills, or night sweats; morbid obesity HEENT: Negative; No changes in vision or hearing, sinus congestion, difficulty swallowing Pulmonary: Negative; No cough, wheezing, shortness of breath, hemoptysis Cardiovascular: Negative; No chest pain, presyncope, syncope, palpitations GI: Negative; No nausea, vomiting, diarrhea, or abdominal pain GU: Negative; No dysuria, hematuria, or difficulty voiding Musculoskeletal: Negative; no myalgias, joint pain, or weakness Hematologic/Oncology: Negative; no easy bruising, bleeding Endocrine: Negative; no heat/cold intolerance; no diabetes Neuro: Negative; no changes in balance, headaches Skin:  Negative; No rashes or skin lesions Psychiatric: Negative; No behavioral problems, depression Sleep: Positive for snoring, nonrestorative sleep; negative; no bruxism, restless legs, hypnogognic hallucinations, no cataplexy Other comprehensive 14 point system review is negative.   PHYSICAL EXAM:   VS:  BP (!) 132/98   Pulse 90   Ht 6' 1"  (1.854 m)   Wt (!) 356 lb (161.5 kg)   SpO2 95%   BMI 46.97 kg/m     Repeat blood pressure by me 140/96  Wt Readings from Last 3 Encounters:  09/11/19 (!) 356 lb (161.5 kg)  08/28/19 (!) 354 lb 3.2 oz (160.7 kg)  08/06/19 (!) 357 lb 3.2 oz (162 kg)    General: Alert, oriented, no distress.  Skin: normal turgor, no rashes, warm and dry HEENT: Normocephalic, atraumatic. Pupils equal round and reactive to light; sclera anicteric; extraocular muscles intact;  Nose without nasal septal hypertrophy Mouth/Parynx benign; Mallinpatti scale 4 Neck: No JVD, no carotid bruits; normal carotid upstroke Lungs: clear to ausculatation and percussion; no wheezing or rales Chest wall: without tenderness to palpitation Heart: PMI not displaced, RRR in the 90s, s1 s2 normal, 1/6 systolic murmur, no diastolic murmur, no rubs, gallops, thrills, or heaves Abdomen: soft, nontender; no hepatosplenomehaly, BS+; abdominal aorta nontender and not dilated by palpation. Back: no CVA tenderness Pulses 2+ Musculoskeletal: full range of motion, normal strength, no joint deformities Extremities: no clubbing cyanosis or edema, Homan's sign negative  Neurologic: grossly nonfocal; Cranial nerves grossly wnl Psychologic: Normal mood and affect   Studies/Labs Reviewed:   EKG:  EKG is ordered today.  ECG (independently read by me): Normal sinus rhythm at 90 bpm.  Inferolateral T wave abnormality.  QTc interval 433 msec.  Pr 178 msec.  No ectopy  August 06, 2019 ECG (independently read by me): Sinus Tachycardia at 105; LAE, nonspecific T changes; QTc 452 msec  Recent Labs: BMP  Latest Ref Rng & Units 08/13/2019 08/07/2019  Glucose 65 - 99 mg/dL 76 CANCELED  BUN 6 - 20 mg/dL 11 CANCELED  Creatinine 0.76 - 1.27 mg/dL 1.02 CANCELED  BUN/Creat Ratio 9 - 20 11 -  Sodium 134 - 144 mmol/L 140 CANCELED  Potassium 3.5 - 5.2  mmol/L 4.4 CANCELED  Chloride 96 - 106 mmol/L 105 CANCELED  CO2 20 - 29 mmol/L 22 CANCELED  Calcium 8.7 - 10.2 mg/dL 8.9 CANCELED     Hepatic Function Latest Ref Rng & Units 08/13/2019 08/07/2019  Total Protein 6.0 - 8.5 g/dL 7.6 CANCELED  Albumin 4.0 - 5.0 g/dL 4.3 CANCELED  AST 0 - 40 IU/L 20 CANCELED  ALT 0 - 44 IU/L 21 CANCELED  Alk Phosphatase 39 - 117 IU/L 64 CANCELED  Total Bilirubin 0.0 - 1.2 mg/dL 0.4 CANCELED    CBC Latest Ref Rng & Units 08/13/2019  WBC 3.4 - 10.8 x10E3/uL 9.7  Hemoglobin 13.0 - 17.7 g/dL 16.3  Hematocrit 37.5 - 51.0 % 46.8  Platelets 150 - 450 x10E3/uL 310   Lab Results  Component Value Date   MCV 83 08/13/2019   Lab Results  Component Value Date   TSH 1.040 08/13/2019   Lab Results  Component Value Date   HGBA1C 5.6 08/13/2019     BNP    Component Value Date/Time   BNP <2.5 08/07/2019 1124    ProBNP No results found for: PROBNP   Lipid Panel     Component Value Date/Time   CHOL 238 (H) 08/13/2019 1311   TRIG 143 08/13/2019 1311   HDL 36 (L) 08/13/2019 1311   CHOLHDL 6.6 (H) 08/13/2019 1311   LDLCALC 176 (H) 08/13/2019 1311   LABVLDL 26 08/13/2019 1311     RADIOLOGY: ECHOCARDIOGRAM COMPLETE  Result Date: 09/01/2019    ECHOCARDIOGRAM REPORT   Patient Name:   STEFFAN CANIGLIA Date of Exam: 09/01/2019 Medical Rec #:  [id number]         Height:       73.0 in Accession #:    [id number]        Weight:       354.2 lb Date of Birth:  [birth year]         BSA:          2.743 m Patient Age:    33 years          BP:           132/88 mmHg Patient Gender: M                 HR:           85 bpm. Exam Location:  [address] Procedure: 2D Echo, Cardiac Doppler, Color Doppler and Intracardiac             Opacification Agent Indications:    I10 Hypertension  History:        Patient has prior history of Echocardiogram examinations, most                 recent 09/01/2008. Morbid obesity. Mild hyperlipidemia.                 Obstructive sleep apnea. ADHD. Asthma.  Sonographer:    Diamond Nickel RCS Referring Phys: [place]  1. Left ventricular ejection fraction, by estimation, is 65 to 70%. The left ventricle has hyperdynamic function. The left ventricle has no regional wall motion abnormalities. There is mild concentric left ventricular hypertrophy. Left ventricular diastolic parameters were normal.  2. Right ventricular systolic function is normal. The right ventricular size is normal. Tricuspid regurgitation signal is inadequate for assessing PA pressure.  3. The mitral valve is normal in structure. Trivial mitral valve regurgitation. No evidence of mitral stenosis.  4. The aortic valve is  tricuspid. Aortic valve regurgitation is trivial. No aortic stenosis is present.  5. The inferior vena cava is normal in size with greater than 50% respiratory variability, suggesting right atrial pressure of 3 mmHg. Comparison(s): Prior images unable to be directly viewed, comparison made by report only. Conclusion(s)/Recommendation(s): Normal biventricular function without evidence of hemodynamically significant valvular heart disease. FINDINGS  Left Ventricle: Left ventricular ejection fraction, by estimation, is 65 to 70%. The left ventricle has hyperdynamic function. The left ventricle has no regional wall motion abnormalities. Definity contrast agent was given IV to delineate the left ventricular endocardial borders. The left ventricular internal cavity size was normal in size. There is mild concentric left ventricular hypertrophy. Left ventricular diastolic parameters were normal. Right Ventricle: The right ventricular size is normal. No increase in right ventricular wall thickness. Right ventricular  systolic function is normal. Tricuspid regurgitation signal is inadequate for assessing PA pressure. Left Atrium: Left atrial size was normal in size. Right Atrium: Right atrial size was normal in size. Pericardium: There is no evidence of pericardial effusion. Presence of pericardial fat pad. Mitral Valve: The mitral valve is normal in structure. Trivial mitral valve regurgitation. No evidence of mitral valve stenosis. Tricuspid Valve: The tricuspid valve is normal in structure. Tricuspid valve regurgitation is trivial. No evidence of tricuspid stenosis. Aortic Valve: The aortic valve is tricuspid. Aortic valve regurgitation is trivial. No aortic stenosis is present. Pulmonic Valve: The pulmonic valve was grossly normal. Pulmonic valve regurgitation is not visualized. No evidence of pulmonic stenosis. Aorta: The aortic root, ascending aorta and aortic arch are all structurally normal, with no evidence of dilitation or obstruction. Pulmonary Artery: The pulmonary artery is not well seen. Venous: The inferior vena cava is normal in size with greater than 50% respiratory variability, suggesting right atrial pressure of 3 mmHg. IAS/Shunts: No atrial level shunt detected by color flow Doppler.  LEFT VENTRICLE PLAX 2D LVIDd:         4.80 cm  Diastology LVIDs:         2.90 cm  LV e' lateral:   11.90 cm/s LV PW:         1.60 cm  LV E/e' lateral: 6.8 LV IVS:        1.80 cm  LV e' medial:    7.83 cm/s LVOT diam:     2.17 cm  LV E/e' medial:  10.3 LV SV:         102 LV SV Index:   37 LVOT Area:     3.69 cm  RIGHT VENTRICLE RV S prime:     13.90 cm/s TAPSE (M-mode): 2.4 cm LEFT ATRIUM             Index       RIGHT ATRIUM           Index LA diam:        3.80 cm 1.39 cm/m  RA Area:     18.40 cm LA Vol (A2C):   41.9 ml 15.27 ml/m RA Volume:   49.50 ml  18.04 ml/m LA Vol (A4C):   34.2 ml 12.47 ml/m LA Biplane Vol: 40.2 ml 14.65 ml/m  AORTIC VALVE LVOT Vmax:   138.00 cm/s LVOT Vmean:  89.200 cm/s LVOT VTI:    0.278 m  AORTA  Ao Root diam: 3.60 cm MITRAL VALVE MV Area (PHT): 4.68 cm    SHUNTS MV Decel Time: 162 msec    Systemic VTI:  0.28 m MV E velocity: 81.00 cm/s  Systemic  Diam: 2.17 cm MV A velocity: 71.10 cm/s MV E/A ratio:  1.14 Buford Dresser MD Electronically signed by Buford Dresser MD Signature Date/Time: 09/01/2019/12:28:30 PM    Final      Additional studies/ records that were reviewed today include:  I was able to obtain a prior office visit from the [facility] dated August 25, 2008.  ASSESSMENT:    1. Hypertension, unspecified type   2. Accelerated hypertension   3. Morbid obesity ([place])   4. Evaluate for OSA (obstructive sleep apnea)      PLAN:  Mr. Shalom Mcguiness is a 34 year old who has a history of documented prior hypertension dating back to at least 2006.  Remotely he had been on Cardizem and when seen at that time had been off therapy for 6 months.  He has had significant weight gain up to a maximum of 380 pounds but over the last several years had lost a significant amount, unfortunately to regain  50 pounds back.  Current weight today is 357 pounds and his body mass index is 47.13.  He has stage II accelerated hypertension.  When I initially saw him for reestablishment I had a long discussion regarding his poorly controlled blood pressure.  He had been on amlodipine at 5 mg prescribed by Dr. Wynetta Emery and since his evaluation with me he has been on 10 mg daily and he was started on Toprol-XL 50 mg.  At office follow-up release later with [facility] our Pharm.D., he was advised to further titrate Toprol-XL to 75 mg.  However he never had a pill cutter and never used a knife to cut pills and therefore has been taking 50 mg daily.  With his resting pulse now in the 90s, I am recommending further titration of Toprol-XL to 100 mg daily.  I had a very long discussion with him concerning his diet.  Discussed at length and optimal Mediterranean diet for optimal heart  health.  I reviewed his laboratory.  Lipid studies are significantly abnormal with LDL cholesterol at 176.  He essentially eats out 5 to 6 days/week and often has his meals also at work which is fast food.  I discussed potential initiation of lipid-lowering therapy had called a prescription for rosuvastatin once his laboratory was resulted.  He would like to try a major lifestyle adjustment change over the next 3 months I have agreed to not start treatment at present.  I discussed the minimum weight goal of less than 325 in 3 months.  I suspect he has significant sleep apnea and he is scheduled to undergo a home sleep study on April 29 for evaluation.   Time spent: 35 minutes  Medication Adjustments/Labs and Tests Ordered: Current medicines are reviewed at length with the patient today.  Concerns regarding medicines are outlined above.  Medication changes, Labs and Tests ordered today are listed in the Patient Instructions below. There are no Patient Instructions on file for this visit.   Signed, Shelva Majestic, MD  09/11/2019 4:19 PM    [facility] [address], [place], [place], [place]  [address] Phone: [phone]

## 2019-09-25 ENCOUNTER — Other Ambulatory Visit: Payer: Self-pay | Admitting: *Deleted

## 2019-09-30 ENCOUNTER — Ambulatory Visit: Payer: BC Managed Care – PPO | Attending: Internal Medicine

## 2019-09-30 DIAGNOSIS — Z23 Encounter for immunization: Secondary | ICD-10-CM

## 2019-09-30 NOTE — Progress Notes (Signed)
   Covid-19 Vaccination Clinic  Name:  Dalton Carter    MRN: [id number] DOB: 17-Sep-1985  09/30/2019  [name] was observed post Covid-19 immunization for 15 minutes without incident. He was provided with Vaccine Information Sheet and instruction to access the V-Safe system.   [name] was instructed to call 911 with any severe reactions post vaccine: Marland Kitchen Difficulty breathing  . Swelling of face and throat  . A fast heartbeat  . A bad rash all over body  . Dizziness and weakness   Immunizations Administered    Name Date Dose VIS Date Route   Pfizer COVID-19 Vaccine 09/30/2019  2:46 PM 0.3 mL 06/06/2019 Intramuscular   Manufacturer: [employer], [employer]   Lot: [id number]   NDC: [address]-4

## 2019-10-09 ENCOUNTER — Ambulatory Visit: Payer: BC Managed Care – PPO

## 2019-10-23 ENCOUNTER — Ambulatory Visit (HOSPITAL_BASED_OUTPATIENT_CLINIC_OR_DEPARTMENT_OTHER): Payer: BC Managed Care – PPO | Attending: Cardiovascular Disease | Admitting: Cardiovascular Disease

## 2021-02-22 ENCOUNTER — Other Ambulatory Visit (HOSPITAL_BASED_OUTPATIENT_CLINIC_OR_DEPARTMENT_OTHER): Payer: Self-pay

## 2021-02-22 DIAGNOSIS — G4733 Obstructive sleep apnea (adult) (pediatric): Secondary | ICD-10-CM

## 2021-02-22 DIAGNOSIS — I10 Essential (primary) hypertension: Secondary | ICD-10-CM

## 2021-02-22 MED ORDER — AMLODIPINE BESYLATE 10 MG PO TABS: 10.0000 mg | ORAL_TABLET | Freq: Every day | ORAL | 3 refills | Status: DC

## 2021-04-06 ENCOUNTER — Other Ambulatory Visit (HOSPITAL_BASED_OUTPATIENT_CLINIC_OR_DEPARTMENT_OTHER): Payer: Self-pay

## 2021-04-06 ENCOUNTER — Other Ambulatory Visit: Payer: Self-pay | Admitting: Cardiovascular Disease

## 2021-04-06 ENCOUNTER — Other Ambulatory Visit: Payer: Self-pay

## 2021-04-06 DIAGNOSIS — H6690 Otitis media, unspecified, unspecified ear: Secondary | ICD-10-CM

## 2021-04-06 MED ORDER — FLUTICASONE PROPIONATE 50 MCG/ACT NA SUSP: 1.0000 | Freq: Every day | NASAL | 2 refills | Status: DC

## 2021-04-06 NOTE — Progress Notes (Signed)
Patient with OSA, HTN, recently diagnosed with otitis media, possible allergic eustachian valve dysfunction component. Prescribed flonase.

## 2021-04-20 ENCOUNTER — Encounter: Payer: Self-pay | Admitting: Cardiology

## 2021-04-20 ENCOUNTER — Other Ambulatory Visit: Payer: Self-pay

## 2021-04-20 ENCOUNTER — Ambulatory Visit (INDEPENDENT_AMBULATORY_CARE_PROVIDER_SITE_OTHER): Payer: Managed Care, Other (non HMO) | Admitting: Cardiology

## 2021-04-20 VITALS — BP 158/90 | HR 100 | Ht 73.0 in | Wt 366.8 lb

## 2021-04-20 DIAGNOSIS — I10 Essential (primary) hypertension: Secondary | ICD-10-CM | POA: Diagnosis not present

## 2021-04-20 DIAGNOSIS — E7800 Pure hypercholesterolemia, unspecified: Secondary | ICD-10-CM | POA: Diagnosis not present

## 2021-04-20 DIAGNOSIS — R55 Syncope and collapse: Secondary | ICD-10-CM | POA: Diagnosis not present

## 2021-04-20 MED ORDER — CARVEDILOL 12.5 MG PO TABS: 12.5000 mg | ORAL_TABLET | Freq: Two times a day (BID) | ORAL | 3 refills | Status: DC

## 2021-04-20 NOTE — Progress Notes (Signed)
HPI: Follow-up hypertension, obesity and recent syncopal episode.  Patient is followed by Dr. Tresa Endo.  Echocardiogram March 2021 showed normal LV function, mild left ventricular hypertrophy, trace aortic insufficiency.  He has a history of compliance issues.  Patient had a recent syncopal episode and was added to my schedule today.  Since last seen patient typically does not have dyspnea on exertion, orthopnea, PND, pedal edema, exertional chest pain or syncope.  He developed a recent URI with congestion and cough.  He denies fevers, chills or hemoptysis.  2 days ago while he was coughing hard he developed frank syncope.  No preceding palpitations, chest pain, nausea.  He was unconscious for seconds.  He had a second episode yesterday.  No other issues since then.  Current Outpatient Medications  Medication Sig Dispense Refill   amLODipine (NORVASC) 10 MG tablet Take 1 tablet (10 mg total) by mouth daily. 90 tablet 3   fluticasone (FLONASE) 50 MCG/ACT nasal spray Place 1 spray into both nostrils daily. 9.9 mL 2   mometasone (ELOCON) 0.1 % ointment Apply topically as needed. (Patient not taking: Reported on 04/20/2021) 45 g 5   No current facility-administered medications for this visit.     Past Medical History:  Diagnosis Date   ADHD (attention deficit hyperactivity disorder)    Asthma    Dyslipidemia    MILD   Eczema 1992   Mononucleosis 05/02/2001    History reviewed. No pertinent surgical history.  Social History   Socioeconomic History   Marital status: Single    Spouse name: Not on file   Number of children: Not on file   Years of education: Not on file   Highest education level: Not on file  Occupational History   Not on file  Tobacco Use   Smoking status: Every Day   Smokeless tobacco: Never  Substance and Sexual Activity   Alcohol use: No   Drug use: Never   Sexual activity: Not on file  Other Topics Concern   Not on file  Social History Narrative   Not  on file   Social Determinants of Health   Financial Resource Strain: Not on file  Food Insecurity: Not on file  Transportation Needs: Not on file  Physical Activity: Not on file  Stress: Not on file  Social Connections: Not on file  Intimate Partner Violence: Not on file    Family History  Problem Relation Age of Onset   Hypertension Mother    Fibromyalgia Mother    Hyperlipidemia Father    Hypertension Maternal Grandmother    Hyperlipidemia Maternal Grandmother    Glaucoma Maternal Grandmother    Hypertension Maternal Grandfather    COPD Maternal Grandfather    Sarcoidosis Maternal Grandfather    Emphysema Maternal Grandfather    Hypertension Paternal Grandmother    Renal Disease Paternal Grandmother    Diabetes Paternal Grandmother    CAD Paternal Grandmother    Hypertension Paternal Grandfather    Diabetes Paternal Grandfather    CAD Paternal Grandfather     ROS: no fevers or chills, productive cough, hemoptysis, dysphasia, odynophagia, melena, hematochezia, dysuria, hematuria, rash, seizure activity, orthopnea, PND, pedal edema, claudication. Remaining systems are negative.  Physical Exam: Well-developed obese in no acute distress.  Skin is warm and dry.  HEENT is normal.  Neck is supple.  Chest is clear to auscultation with normal expansion.  Cardiovascular exam is regular rate and rhythm.  Abdominal exam nontender or distended. No masses palpated.  Extremities show no edema. neuro grossly intact  ECG-sinus tachycardia at a rate of 111, no significant ST changes.  Personally reviewed  A/P  1 syncope-history is consistent with cough syncope.  We will plan an echocardiogram to assess LV function for completeness.  Therapy is to treat congestion/cough.  He needs to follow-up with primary care for decongestant and cough medications.  2 hypertension-blood pressure is elevated.  He is not taking Toprol.  Continue amlodipine and add carvedilol 12.5 mg twice daily.   Follow blood pressure and adjust medications as needed.    3 morbid obesity-we discussed the importance of diet, exercise and weight loss.  4 hyperlipidemia-continue statin.  Check lipids and liver.  Olga Millers, MD

## 2021-04-20 NOTE — Patient Instructions (Signed)
Medication Instructions:   START CARVEDILOL 12.5 MG ONE TABLET TWICE DAILY  *If you need a refill on your cardiac medications before your next appointment, please call your pharmacy*  Testing/Procedures:  Your physician has requested that you have an echocardiogram. Echocardiography is a painless test that uses sound waves to create images of your heart. It provides your doctor with information about the size and shape of your heart and how well your heart's chambers and valves are working. This procedure takes approximately one hour. There are no restrictions for this procedure. [address]   Follow-Up: At [facility], you and your health needs are our priority.  As part of our continuing mission to provide you with exceptional heart care, we have created designated Provider Care Teams.  These Care Teams include your primary Cardiologist (physician) and Advanced Practice Providers (APPs -  Physician Assistants and Nurse Practitioners) who all work together to provide you with the care you need, when you need it.  We recommend signing up for the patient portal called "MyChart".  Sign up information is provided on this After Visit Summary.  MyChart is used to connect with patients for Virtual Visits (Telemedicine).  Patients are able to view lab/test results, encounter notes, upcoming appointments, etc.  Non-urgent messages can be sent to your provider as well.   To learn more about what you can do with MyChart, go to [email or web address].    Your next appointment:   3 month(s)  The format for your next appointment:   In Person  Provider:   Olga Millers, MD

## 2021-05-11 ENCOUNTER — Ambulatory Visit (HOSPITAL_COMMUNITY): Payer: Managed Care, Other (non HMO) | Attending: Cardiovascular Disease

## 2021-05-11 ENCOUNTER — Encounter (HOSPITAL_COMMUNITY): Payer: Self-pay

## 2021-05-11 ENCOUNTER — Encounter (HOSPITAL_COMMUNITY): Payer: Self-pay | Admitting: Cardiology

## 2021-05-11 NOTE — Progress Notes (Signed)
Verified appointment "no show" status with G. Andrews at [facility].

## 2021-06-06 ENCOUNTER — Other Ambulatory Visit (HOSPITAL_BASED_OUTPATIENT_CLINIC_OR_DEPARTMENT_OTHER): Payer: Self-pay | Admitting: Cardiovascular Disease

## 2021-06-06 DIAGNOSIS — G4733 Obstructive sleep apnea (adult) (pediatric): Secondary | ICD-10-CM

## 2021-06-06 DIAGNOSIS — I10 Essential (primary) hypertension: Secondary | ICD-10-CM

## 2021-07-26 ENCOUNTER — Ambulatory Visit (INDEPENDENT_AMBULATORY_CARE_PROVIDER_SITE_OTHER): Payer: Managed Care, Other (non HMO) | Admitting: Cardiovascular Disease

## 2021-07-26 ENCOUNTER — Other Ambulatory Visit: Payer: Self-pay

## 2021-07-26 ENCOUNTER — Encounter: Payer: Self-pay | Admitting: Cardiovascular Disease

## 2021-07-26 ENCOUNTER — Other Ambulatory Visit: Payer: Self-pay | Admitting: Cardiovascular Disease

## 2021-07-26 DIAGNOSIS — E7800 Pure hypercholesterolemia, unspecified: Secondary | ICD-10-CM

## 2021-07-26 DIAGNOSIS — I10 Essential (primary) hypertension: Secondary | ICD-10-CM

## 2021-07-26 DIAGNOSIS — R0683 Snoring: Secondary | ICD-10-CM

## 2021-07-26 DIAGNOSIS — G4733 Obstructive sleep apnea (adult) (pediatric): Secondary | ICD-10-CM | POA: Diagnosis not present

## 2021-07-26 DIAGNOSIS — G478 Other sleep disorders: Secondary | ICD-10-CM

## 2021-07-26 DIAGNOSIS — R55 Syncope and collapse: Secondary | ICD-10-CM

## 2021-07-26 DIAGNOSIS — Z79899 Other long term (current) drug therapy: Secondary | ICD-10-CM

## 2021-07-26 MED ORDER — METOPROLOL SUCCINATE ER 50 MG PO TB24: 50.0000 mg | ORAL_TABLET | Freq: Every day | ORAL | 6 refills | Status: DC

## 2021-07-26 NOTE — Progress Notes (Signed)
Cardiology Office Note    Date:  07/31/2021   ID:  Dalton Carter, DOB 08-Feb-1986, MRN [id number]  PCP:  Denita Lung, MD  Cardiologist:  Shelva Majestic, MD   72-monthfollow-up cardiology evaluation.  History of Present Illness:  Dalton DOHSEis a 36y.o. male who I had initially seen him in February 2006.  He has a history of hypertension and obesity.  Remotely, he had been on Cardizem for almost a year prior to his initial evaluation with me.  When I first saw him in 2006 he weighed 275 pounds.  In 2008 he was 305 pounds.  An echo Doppler study  in 2008 showed mild to moderate left ventricular hypertrophy with wall thickness of 1.4 cm and left atrial dimension at 4.1 cm.  I saw him in March 2010 and his weight was 341 pounds.  At that time he was on Advair twice daily for intermittent asthma.  During his evaluation I was concerned about obstructive sleep apnea.  He had a poor diet with significant fast food intake as well as sweets.  I recommended he undergo a diagnostic polysomnogram and also provided him with samples of olmesartan to initiate blood pressure lowering medication.  I have not seen him over the past 11 years and was reevaluated by me in February 2021.  At 1 time he may have been given a prescription by Dr. ROswaldo Milianfor amlodipine but apparently stopped therapy.  He weight peaked at 380 pounds but over the past 3 years had lost a significant amount of weight when he was doing significant amount of exercise.  He had been off blood pressure medications for over several years.  Blood pressure in the past had risen to 200/120.  He resume meds but apparently has been off medications for the past year.  With recent documentation of significant blood pressure elevation he resumed taking amlodipine for the past month at 5 mg.  He was not exercising as much as he had in the past.  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  Typically he was going to bed at  midnight but may be up after only 3 or 4 hours of sleep.  He admits to snoring.  He does not fall asleep during the day.  There is significant family history for obstructive sleep apnea in both his mother and older brother.  He never pursued my previous recommendation to have a sleep study over 10 years ago.  He has been trying to improve his diet and has not been eating fast foods as much as he had remotely.  During my evaluation, he had accelerated hypertension with blood pressure recorded by me at 166/120 and repeat 170/118.  He was tachycardic with a heart rate of 105.  For the previous month he was on amlodipine 5 mg and I recommended titration to 10 mg daily.  I also initiated Toprol-XL 50 mg.  I recommended he undergo a 2D echo Doppler study, laboratory and scheduled him for a follow-up visit in 2 weeks with KCyril Mourningour Pharm.D.  A  I scheduled him for an echo Doppler study which was done on September 01, 2019 and showed an EF of 65 to 70% with mild concentric LVH.  He had normal diastolic parameters.  Laboratory was notable for normal renal function and thyroid function studies as well as normal LFTs.  CBC was normal.  He had significant elevation of lipids with total  cholesterol 238, triglycerides 143, HDL 36, LDL 176.  He saw Cyril Mourning for follow-up evaluation on August 28, 2019.  At that time blood pressure was improved but still elevated and she recommended further titration of Toprol-XL to 75 mg.   I last saw him on September 11, 2019.  Since his follow-up evaluation with Cyril Mourning he states there have been a couple of days where he admits to not taking his medications.  He apparently did not have a pill cutter and never increased the Toprol-XL. During my  evaluation on March 18 I had I had a lengthy discussion with him regarding his daily diet.  He was typically eating out at least 5 days/week, often has meat every day if not several times per day but typically has been trying to eat more chicken rather than  red meat.  During that evaluation, with his resting pulse in the 90s I recommended further titration of Toprol-XL to 100 mg daily.  I had a very lengthy discussion with him concerning changing his diet and discuss a heart healthy Mediterranean diet with him in detail.  I suspected he had significant sleep apnea and scheduled him to undergo a home sleep study for the end of April.    Since I last saw him, he never had his sleep study.  He developed a syncopal episode in October 2022 and was seen by Dr. Stanford Breed.  He had developed a recent URI with congestion and cough and following an significant coughing episode he developed syncope and denied any prodrome of palpitations.  It was felt that his syncope was consistent with cough syncope.  It was recommended he have a follow-up echo Doppler study.  His blood pressure was elevated.  He was not taking metoprolol.  He was on amlodipine and carvedilol was added.  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  His hours are now regular Monday through Friday 8-5.  He continues to be on amlodipine 10 mg and carvedilol 12.5 mg twice a day.  However he often misses taking the evening dose.  His sleep is poor.  He typically stays up after midnight watching television and on average sleeps 4 to 5 hours per night and typically wakes up around 6:30 AM.  He had never used his previous prescription for Toprol.  He admits to fatigue.  He has not had recent laboratory.  He presents for evaluation.   Past Medical History:  Diagnosis Date   ADHD (attention deficit hyperactivity disorder)    Asthma    Dyslipidemia    MILD   Eczema 1992   Mononucleosis 05/02/2001     Current Medications: Outpatient Medications Prior to Visit  Medication Sig Dispense Refill   amLODipine (NORVASC) 10 MG tablet Take 1 tablet (10 mg total) by mouth daily. 90 tablet 3   fluticasone (FLONASE) 50 MCG/ACT nasal spray Place 1 spray into  both nostrils daily. 9.9 mL 2   mometasone (ELOCON) 0.1 % ointment Apply topically as needed. 45 g 5   carvedilol (COREG) 12.5 MG tablet Take 1 tablet (12.5 mg total) by mouth 2 (two) times daily. 180 tablet 3   No facility-administered medications prior to visit.     Allergies:   Peanuts [peanut oil]   Social History   Socioeconomic History   Marital status: Single    Spouse name: Not on file   Number of children: Not on file   Years of education: Not on file  Highest education level: Not on file  Occupational History   Not on file  Tobacco Use   Smoking status: Every Day   Smokeless tobacco: Never  Substance and Sexual Activity   Alcohol use: No   Drug use: Never   Sexual activity: Not on file  Other Topics Concern   Not on file  Social History Narrative   Not on file   Social Determinants of Health   Financial Resource Strain: Not on file  Food Insecurity: Not on file  Transportation Needs: Not on file  Physical Activity: Not on file  Stress: Not on file  Social Connections: Not on file     Social history is that he is married since 2014.  He has a daughter who is now 17 from a previous girlfriend and a son age 16 with his current wife.  He is the son of our sleep nurse coordinator, Barry Brunner.  He is no longer working in the [employer].  He is now employed working for [employer] and child protection services.   Family History:  The patient's family history includes CAD in his paternal grandfather and paternal grandmother; COPD in his maternal grandfather; Diabetes in his paternal grandfather and paternal grandmother; Emphysema in his maternal grandfather; Fibromyalgia in his mother; Glaucoma in his maternal grandmother; Hyperlipidemia in his father and maternal grandmother; Hypertension in his maternal grandfather, maternal grandmother, mother, paternal grandfather, and paternal grandmother; Renal Disease in his paternal grandmother; Sarcoidosis in his  maternal grandfather.   ROS General: Negative; No fevers, chills, or night sweats; morbid obesity HEENT: Negative; No changes in vision or hearing, sinus congestion, difficulty swallowing Pulmonary: Negative; No cough, wheezing, shortness of breath, hemoptysis Cardiovascular: Negative; No chest pain, presyncope, syncope, palpitations GI: Negative; No nausea, vomiting, diarrhea, or abdominal pain GU: Negative; No dysuria, hematuria, or difficulty voiding Musculoskeletal: Negative; no myalgias, joint pain, or weakness Hematologic/Oncology: Negative; no easy bruising, bleeding Endocrine: Negative; no heat/cold intolerance; no diabetes Neuro: Negative; no changes in balance, headaches Skin: Negative; No rashes or skin lesions Psychiatric: Negative; No behavioral problems, depression Sleep: Positive for snoring, nonrestorative sleep; negative; no bruxism, restless legs, hypnogognic hallucinations, no cataplexy Other comprehensive 14 point system review is negative.   PHYSICAL EXAM:   VS:  BP 131/83    Pulse 98    Ht _0  (1.854 m)    Wt (!) 373 lb (169.2 kg)    SpO2 98%    BMI 49.21 kg/m     Repeat blood pressure by me was 130/88  Wt Readings from Last 3 Encounters:  07/26/21 (!) 373 lb (169.2 kg)  04/20/21 (!) 366 lb 12.8 oz (166.4 kg)  09/11/19 (!) 356 lb (161.5 kg)    General: Alert, oriented, no distress.  Morbid obesity. Skin: normal turgor, no rashes, warm and dry HEENT: Normocephalic, atraumatic. Pupils equal round and reactive to light; sclera anicteric; extraocular muscles intact;  Nose without nasal septal hypertrophy Mouth/Parynx benign; Mallinpatti scale 4 Neck: Thick neck; no JVD, no carotid bruits; normal carotid upstroke Lungs: clear to ausculatation and percussion; no wheezing or rales Chest wall: without tenderness to palpitation Heart: PMI not displaced, heart rate in the upper 90s; s1 s2 normal, 1/6 systolic murmur, no diastolic murmur, no rubs, gallops, thrills,  or heaves Abdomen: soft, nontender; no hepatosplenomehaly, BS+; abdominal aorta nontender and not dilated by palpation. Back: no CVA tenderness Pulses 2+ Musculoskeletal: full range of motion, normal strength, no joint deformities Extremities: no clubbing cyanosis or edema,  Homan's sign negative  Neurologic: grossly nonfocal; Cranial nerves grossly wnl Psychologic: Normal mood and affect   Studies/Labs Reviewed:   July 26, 2021 ECG (independently read by me): NSR at 98, no ectopy, normal intervals  I personally reviewed the ECG of April 20, 2021 which showed sinus tachycardia at 111 bpm.  September 11, 2019 ECG (independently read by me): Normal sinus rhythm at 90 bpm.  Inferolateral T wave abnormality.  QTc interval 433 msec.  Pr 178 msec.  No ectopy  August 06, 2019 ECG (independently read by me): Sinus Tachycardia at 105; LAE, nonspecific T changes; QTc 452 msec  Recent Labs: BMP Latest Ref Rng & Units 08/13/2019 08/07/2019  Glucose 65 - 99 mg/dL 76 CANCELED  BUN 6 - 20 mg/dL 11 CANCELED  Creatinine 0.76 - 1.27 mg/dL 1.02 CANCELED  BUN/Creat Ratio 9 - 20 11 -  Sodium 134 - 144 mmol/L 140 CANCELED  Potassium 3.5 - 5.2 mmol/L 4.4 CANCELED  Chloride 96 - 106 mmol/L 105 CANCELED  CO2 20 - 29 mmol/L 22 CANCELED  Calcium 8.7 - 10.2 mg/dL 8.9 CANCELED     Hepatic Function Latest Ref Rng & Units 08/13/2019 08/07/2019  Total Protein 6.0 - 8.5 g/dL 7.6 CANCELED  Albumin 4.0 - 5.0 g/dL 4.3 CANCELED  AST 0 - 40 IU/L 20 CANCELED  ALT 0 - 44 IU/L 21 CANCELED  Alk Phosphatase 39 - 117 IU/L 64 CANCELED  Total Bilirubin 0.0 - 1.2 mg/dL 0.4 CANCELED    CBC Latest Ref Rng & Units 08/13/2019  WBC 3.4 - 10.8 x10E3/uL 9.7  Hemoglobin 13.0 - 17.7 g/dL 16.3  Hematocrit 37.5 - 51.0 % 46.8  Platelets 150 - 450 x10E3/uL 310   Lab Results  Component Value Date   MCV 83 08/13/2019   Lab Results  Component Value Date   TSH 1.040 08/13/2019   Lab Results  Component Value Date   HGBA1C  5.6 08/13/2019     BNP    Component Value Date/Time   BNP <2.5 08/07/2019 1124    ProBNP No results found for: PROBNP   Lipid Panel     Component Value Date/Time   CHOL 238 (H) 08/13/2019 1311   TRIG 143 08/13/2019 1311   HDL 36 (L) 08/13/2019 1311   CHOLHDL 6.6 (H) 08/13/2019 1311   LDLCALC 176 (H) 08/13/2019 1311   LABVLDL 26 08/13/2019 1311     RADIOLOGY: No results found.    Additional studies/ records that were reviewed today include:  I was able to obtain a prior office visit from the [facility] and [facility] dated August 25, 2008.  ASSESSMENT:    1. Hypertension, unspecified type   2. Evaluate for OSA (obstructive sleep apnea)   3. Pure hypercholesterolemia   4. Snoring   5. Non-restorative sleep   6. Morbid obesity ([place])   7. History of cough induced syncope   8. Medication management     PLAN:  Dalton Carter is a 36 year old who has a history of morbid obesity and documented  hypertension dating back to at least 2006.  Remotely he had been on Cardizem and when seen at that time had been off therapy for 6 months.  He has had significant weight gain up to a maximum of 380 pounds.  Over several years his weight has been up and down and in March 2021 weight was 356, October 2022 366, and now 373.  BMI is approaching super morbid obesity however he has muscular and has a  mesomorphic body habitus.  Over the years he has not been compliant with medical recommendations regarding blood pressure control as well as evaluation for sleep apnea.  Most recently, he is now on amlodipine 10 mg and is supposed to be on carvedilol 12.5 mg twice a day but often he misses taking the evening dose.  His resting pulse today is 98.  When he was treated with metoprolol, apparently he never took this.  Since he does not seem to be compliant with twice daily dosing I have recommended he discontinue carvedilol and will start back metoprolol succinate 50 mg in the morning.   He never had a follow-up echo that was recommended by Dr. Stanford Breed and I am schedule him for follow-up echocardiography.  His sleep is very poor and he has very poor sleep hygiene.  He typically stays up after midnight to 1 to 2 AM watching television and gets up at 630.  As result he is only sleeping 4 to 5 hours per night.  I discussed with him optimal sleep duration for an adult at 7 and 9 hours.  I suspect he has severe sleep apnea.  In the past we have tried to schedule him for home study which he never did.  As result I will try to schedule him for an in lab split-night titration due to high suspicion for his OSA.  He has not had recent laboratory.  Laboratory in February 2021 showed significant LDL cholesterol elevation at 176.  He has not been on lipid-lowering therapy.  I am scheduling him for comprehensive metabolic panel, CBC, TSH and lipid studies.  We discussed the importance of increased exercise and weight loss.  I will see him in 3 to 4 months for reevaluation or sooner as needed.   Medication Adjustments/Labs and Tests Ordered: Current medicines are reviewed at length with the patient today.  Concerns regarding medicines are outlined above.  Medication changes, Labs and Tests ordered today are listed in the Patient Instructions below. Patient Instructions  Medication Instructions:  STOP CARVEDILOL  START METOPROLOL SUCC $RemoveBeforeD'50MG'[id number]$  DAILY  *If you need a refill on your cardiac medications before your next appointment, please call your pharmacy*  Lab Work: FASTING LIPID,CMET,CBC AND TSH If you have labs (blood work) drawn today and your tests are completely normal, you will receive your results only by:  [place] (if you have MyChart) OR A paper copy in the mail.  If you have any lab test that is abnormal or we need to change your treatment, we will call you to review the results. You may go to any [facility] that is convenient for you however, we do have a lab in our office that is able  to assist you. You DO NOT need an appointment for our lab. The lab is open 8:00am and closes at 4:00pm. Lunch 12:45 - 1:45pm.  Testing/Procedures: Echocardiogram - Your physician has requested that you have an echocardiogram. Echocardiography is a painless test that uses sound waves to create images of your heart. It provides your doctor with information about the size and shape of your heart and how well your hearts chambers and valves are working. This procedure takes approximately one hour. There are no restrictions for this procedure. This will be performed at either our [facility] location - [address], [place] location [facility] 2nd floor.  Your physician has recommended that you have a sleep study. This test records several body functions during sleep, including:  brain activity, eye movement, oxygen and carbon dioxide blood levels, heart rate and rhythm, breathing rate and rhythm, the flow of air through your mouth and nose, snoring, body muscle movements, and chest and belly movement. SOMEONE WILL BE CALLING TO SCHEDULE THIS  Special Instructions CONTINUE WEIGHT LOSS  Follow-Up: Your next appointment:  3-4 month(s) In Person with Shelva Majestic, MD   At [facility], you and your health needs are our priority.  As part of our continuing mission to provide you with exceptional heart care, we have created designated Provider Care Teams.  These Care Teams include your primary Cardiologist (physician) and Advanced Practice Providers (APPs -  Physician Assistants and Nurse Practitioners) who all work together to provide you with the care you need, when you need it.       Signed, Shelva Majestic, MD  07/31/2021 4:09 PM    [facility] [address] [place], [place], [place]  [address] Phone: [phone]

## 2021-07-26 NOTE — Patient Instructions (Signed)
Medication Instructions:  STOP CARVEDILOL  START METOPROLOL SUCC 50MG  DAILY  *If you need a refill on your cardiac medications before your next appointment, please call your pharmacy*  Lab Work: FASTING LIPID,CMET,CBC AND TSH If you have labs (blood work) drawn today and your tests are completely normal, you will receive your results only by:  [place] (if you have MyChart) OR A paper copy in the mail.  If you have any lab test that is abnormal or we need to change your treatment, we will call you to review the results. You may go to any [facility] that is convenient for you however, we do have a lab in our office that is able to assist you. You DO NOT need an appointment for our lab. The lab is open 8:00am and closes at 4:00pm. Lunch 12:45 - 1:45pm.  Testing/Procedures: Echocardiogram - Your physician has requested that you have an echocardiogram. Echocardiography is a painless test that uses sound waves to create images of your heart. It provides your doctor with information about the size and shape of your heart and how well your hearts chambers and valves are working. This procedure takes approximately one hour. There are no restrictions for this procedure. This will be performed at either our [facility] location - [address], [place] location [facility] 2nd floor.  Your physician has recommended that you have a sleep study. This test records several body functions during sleep, including: brain activity, eye movement, oxygen and carbon dioxide blood levels, heart rate and rhythm, breathing rate and rhythm, the flow of air through your mouth and nose, snoring, body muscle movements, and chest and belly movement. SOMEONE WILL BE CALLING TO SCHEDULE THIS  Special Instructions CONTINUE WEIGHT LOSS  Follow-Up: Your next appointment:  3-4 month(s) In Person with Shelva Majestic, MD   At [facility], you and your health needs are our priority.  As part of  our continuing mission to provide you with exceptional heart care, we have created designated Provider Care Teams.  These Care Teams include your primary Cardiologist (physician) and Advanced Practice Providers (APPs -  Physician Assistants and Nurse Practitioners) who all work together to provide you with the care you need, when you need it.

## 2021-07-31 ENCOUNTER — Encounter: Payer: Self-pay | Admitting: Cardiovascular Disease

## 2021-08-12 ENCOUNTER — Other Ambulatory Visit: Payer: Self-pay

## 2021-08-12 ENCOUNTER — Ambulatory Visit (HOSPITAL_COMMUNITY): Payer: Managed Care, Other (non HMO) | Attending: Internal Medicine

## 2021-08-12 DIAGNOSIS — I10 Essential (primary) hypertension: Secondary | ICD-10-CM | POA: Insufficient documentation

## 2021-08-12 DIAGNOSIS — G478 Other sleep disorders: Secondary | ICD-10-CM | POA: Insufficient documentation

## 2021-08-12 DIAGNOSIS — Z79899 Other long term (current) drug therapy: Secondary | ICD-10-CM | POA: Diagnosis present

## 2021-08-12 DIAGNOSIS — R55 Syncope and collapse: Secondary | ICD-10-CM | POA: Insufficient documentation

## 2021-08-12 DIAGNOSIS — R0683 Snoring: Secondary | ICD-10-CM | POA: Insufficient documentation

## 2021-08-12 LAB — ECHOCARDIOGRAM COMPLETE
Area-P 1/2: 4.86 cm2
S' Lateral: 2.7 cm

## 2021-08-12 MED ORDER — PERFLUTREN LIPID MICROSPHERE
1.0000 mL | INTRAVENOUS | Status: AC | PRN
  Administered 2021-08-12: 1 mL via INTRAVENOUS
  Administered 2021-08-12: 3 mL via INTRAVENOUS

## 2021-09-01 ENCOUNTER — Other Ambulatory Visit: Payer: Self-pay

## 2021-09-01 ENCOUNTER — Ambulatory Visit (HOSPITAL_BASED_OUTPATIENT_CLINIC_OR_DEPARTMENT_OTHER): Payer: Managed Care, Other (non HMO) | Attending: Cardiovascular Disease | Admitting: Cardiovascular Disease

## 2021-09-01 DIAGNOSIS — R0683 Snoring: Secondary | ICD-10-CM | POA: Insufficient documentation

## 2021-09-01 DIAGNOSIS — G4710 Hypersomnia, unspecified: Secondary | ICD-10-CM | POA: Diagnosis not present

## 2021-09-01 DIAGNOSIS — G4733 Obstructive sleep apnea (adult) (pediatric): Secondary | ICD-10-CM | POA: Insufficient documentation

## 2021-09-01 DIAGNOSIS — I10 Essential (primary) hypertension: Secondary | ICD-10-CM | POA: Diagnosis present

## 2021-09-01 DIAGNOSIS — Z6841 Body Mass Index (BMI) 40.0 and over, adult: Secondary | ICD-10-CM | POA: Insufficient documentation

## 2021-09-01 DIAGNOSIS — R5383 Other fatigue: Secondary | ICD-10-CM | POA: Diagnosis not present

## 2021-09-01 DIAGNOSIS — G4736 Sleep related hypoventilation in conditions classified elsewhere: Secondary | ICD-10-CM | POA: Diagnosis not present

## 2021-09-02 ENCOUNTER — Telehealth: Payer: Self-pay | Admitting: *Deleted

## 2021-09-02 ENCOUNTER — Encounter (HOSPITAL_BASED_OUTPATIENT_CLINIC_OR_DEPARTMENT_OTHER): Payer: Self-pay | Admitting: Cardiovascular Disease

## 2021-09-02 NOTE — Telephone Encounter (Signed)
Patient notified of sleep study results and recommendations. He agrees to proceed with CPAP therapy. He has access to a AirSense 10 CPAP machine that belonged to a family member no longer in use. Pressure will be set per Dr Landry Dyke order. Will be managed by [facility]. ?

## 2021-09-02 NOTE — Procedures (Addendum)
? ? ? ? ?Patient Name: Dalton Carter, Dalton Carter ?Study Date: 09/01/2021 ?Gender: Male ?D.O.B: [birth year] ?Age (years): 63 ?Referring Provider: Shelva Majestic MD, ABSM ?Height (inches): 73 ?Interpreting Physician: Shelva Majestic MD, ABSM ?Weight (lbs): 370 ?RPSGT: Jorge Ny ?BMI: 49 ?MRN: [id number] ?Neck Size: 19.00 ? ?CLINICAL INFORMATION ?Sleep Study Type: Split Night CPAP ? ?Indication for sleep study: Obesity, fatigue, snoring, daytime sleepiness ? ?Epworth Sleepiness Score: 3 ? ?SLEEP STUDY TECHNIQUE ?As per the AASM Manual for the Scoring of Sleep and Associated Events v2.3 (April 2016) with a hypopnea requiring 4% desaturations. ? ?The channels recorded and monitored were frontal, central and occipital EEG, electrooculogram (EOG), submentalis EMG (chin), nasal and oral airflow, thoracic and abdominal wall motion, anterior tibialis EMG, snore microphone, electrocardiogram, and pulse oximetry. Continuous positive airway pressure (CPAP) was initiated when the patient met split night criteria and was titrated according to treat sleep-disordered breathing. ? ?MEDICATIONS ?amLODipine (NORVASC) 10 MG tablet ?fluticasone (FLONASE) 50 MCG/ACT nasal spray ?metoprolol succinate (TOPROL-XL) 50 MG 24 hr tablet ?mometasone (ELOCON) 0.1 % ointment  ?Medications self-administered by patient taken the night of the study : N/A ? ?RESPIRATORY PARAMETERS ?Diagnostic ?Total AHI (/hr): 145.0 RDI (/hr): 145.5 OA Index (/hr): - CA Index (/hr): 0.0 ?REM AHI (/hr): N/A NREM AHI (/hr): 145.0 Supine AHI (/hr): 145.0 Non-supine AHI (/hr): 0 ?Min O2 Sat (%): 78.0 Mean O2 (%): 89.9 Time below 88% (min): 49.8  ? ?Titration ?Optimal Pressure (cm): 16 AHI at Optimal Pressure (/hr): 0.7 Min O2 at Optimal Pressure (%): 89.0 ?Supine % at Optimal (%): 83 Sleep % at Optimal (%): 99  ? ?SLEEP ARCHITECTURE ?The recording time for the entire night was 379.9 minutes. ? ?During a baseline period of 172.4 minutes, the patient slept for 124.6 minutes in  REM and nonREM, yielding a sleep efficiency of 72.3%%. Sleep onset after lights out was 37.8 minutes with a REM latency of N/A minutes. The patient spent 6.8%% of the night in stage N1 sleep, 93.2%% in stage N2 sleep, 0.0%% in stage N3 and 0% in REM. ? ?During the titration period of 197.2 minutes, the patient slept for 182.6 minutes in REM and nonREM, yielding a sleep efficiency of 92.6%%. Sleep onset after CPAP initiation was 8.1 minutes with a REM latency of 81.0 minutes. The patient spent 3.8%% of the night in stage N1 sleep, 46.3%% in stage N2 sleep, 0.0%% in stage N3 and 49.8% in REM. ? ?CARDIAC DATA ?The 2 lead EKG demonstrated sinus rhythm. The mean heart rate was 100.0 beats per minute. Other EKG findings include: None. ? ?LEG MOVEMENT DATA ?The total Periodic Limb Movements of Sleep (PLMS) were 0. The PLMS index was 0.0. ? ?IMPRESSIONS ?- Very severe obstructive sleep apnea occurred during the diagnostic portion of the study (AHI 145.0/h with absent REM sleep). CPAP was initiated at 8 cm and was titrated to optimal PAP pressure at 16 cm of water (AHI 0.7/h; RDI 0.7/h; O2 nadir 89%).  At 16 cm of water patient slept 70 minutes out of 76 minutes with REM sleep in the supine position.  ?- Moderate oxygen desaturation was noted during the diagnostic portion of the study to a nadir of 78%. ?- Absent REM sleep on the diagnotic evaluation with significant REM rebound with CPAP therapy. ?- The patient snored with moderate snoring volume during the diagnostic portion of the study. ?- Periods of sinus tachcardia and PACs were present  during this study. ?- Clinically significant periodic limb movements did not occur during sleep. ? ?DIAGNOSIS ?-  Obstructive Sleep Apnea (G47.33) ?- Nocturnal hypoxemia ? ?RECOMMENDATIONS ?- Recommend an expeditious initial trial of CPAP Auto therapy with EPR of 3 at 16 - 20 cm H2O with heated humidification.  A Large size [employer]Paykel Full Face Evora Full mask was used for the  titration. ?- Effort should be made to optimize nasal and oropharyngeal patency. ?- Avoid alcohol, sedatives and other CNS depressants that may worsen sleep apnea and disrupt normal sleep architecture. ?- Sleep hygiene should be reviewed to assess factors that may improve sleep quality. ?- Weight management (BMI 49) and regular exercise should be initiated or continued. ?- Recommend a download and sleep clinic evaluation after 4 weeks of therapy. ? ?[Electronically signed] 09/02/2021 09:19 AM ? ?Shelva Majestic MD, [facility], ABSM ?[place], [place] Board of Sleep Medicine ? ? ?NPI: [id number] ? ?[place] ?PH: [phone]   FX: [phone] ?ACCREDITED BY THE AMERICAN ACADEMY OF SLEEP MEDICINE ? ?

## 2021-09-02 NOTE — Telephone Encounter (Signed)
-----   Message from Lennette Bihari, MD sent at 09/02/2021  9:34 AM EST ----- ?Burna Mortimer, as per discussion, initiate expeditious CPAP ?

## 2021-11-11 ENCOUNTER — Ambulatory Visit: Payer: Managed Care, Other (non HMO) | Admitting: Cardiovascular Disease

## 2022-09-08 ENCOUNTER — Ambulatory Visit: Payer: BC Managed Care – PPO | Attending: Cardiology | Admitting: Cardiology

## 2022-09-08 ENCOUNTER — Encounter: Payer: Self-pay | Admitting: Cardiology

## 2022-09-08 ENCOUNTER — Other Ambulatory Visit (HOSPITAL_COMMUNITY): Payer: Self-pay

## 2022-09-08 VITALS — BP 154/100 | HR 92 | Ht 73.0 in | Wt 372.0 lb

## 2022-09-08 DIAGNOSIS — G4733 Obstructive sleep apnea (adult) (pediatric): Secondary | ICD-10-CM

## 2022-09-08 DIAGNOSIS — Z720 Tobacco use: Secondary | ICD-10-CM

## 2022-09-08 DIAGNOSIS — I10 Essential (primary) hypertension: Secondary | ICD-10-CM

## 2022-09-08 DIAGNOSIS — Z131 Encounter for screening for diabetes mellitus: Secondary | ICD-10-CM

## 2022-09-08 MED ORDER — AMLODIPINE BESYLATE 10 MG PO TABS
10.0000 mg | ORAL_TABLET | Freq: Every day | ORAL | 3 refills | Status: AC
  Filled 2022-09-08: qty 90, 90d supply, fill #0

## 2022-09-08 NOTE — Progress Notes (Signed)
Cardiology Office Note:    Date:  09/08/2022   ID:  Dalton Carter, DOB [birth year], MRN [id number]  PCP:  Merryl Hacker, No  Cardiologist:  Shelva Majestic, MD  Electrophysiologist:  None   Referring MD: No ref. provider found   " I am doing ok"  History of Present Illness:    Dalton Carter is a 37 y.o. male with a hx of morbid obesity, hypertension, sleep apnea on CPAP, asthma, dyslipidemia and smoking.  He previously followed with Dr. Shelva Majestic and is transitioning his care.  He was last seen by Dr. Claiborne Billings on July 26, 2021 during that visit we talked about his medication for his antihypertensive, counseling on his OSA as well and his blood work was done.  Today he offers no specific complaints at this time.  He just wanted to get set up again and get back on track with his medications.   Past Medical History:  Diagnosis Date   ADHD (attention deficit hyperactivity disorder)    Asthma    Dyslipidemia    MILD   Eczema 1992   Mononucleosis 05/02/2001    No past surgical history on file.  Current Medications: Current Meds  Medication Sig   mometasone (ELOCON) 0.1 % ointment Apply topically as needed.     Allergies:   Peanuts [peanut oil]   Social History   Socioeconomic History   Marital status: Single    Spouse name: Not on file   Number of children: Not on file   Years of education: Not on file   Highest education level: Not on file  Occupational History   Not on file  Tobacco Use   Smoking status: Every Day   Smokeless tobacco: Never  Substance and Sexual Activity   Alcohol use: No   Drug use: Never   Sexual activity: Not on file  Other Topics Concern   Not on file  Social History Narrative   Not on file   Social Determinants of Health   Financial Resource Strain: Not on file  Food Insecurity: Not on file  Transportation Needs: Not on file  Physical Activity: Not on file  Stress: Not on file  Social Connections: Not on file     Family  History: The patient's family history includes CAD in his paternal grandfather and paternal grandmother; COPD in his maternal grandfather; Diabetes in his paternal grandfather and paternal grandmother; Emphysema in his maternal grandfather; Fibromyalgia in his mother; Glaucoma in his maternal grandmother; Hyperlipidemia in his father and maternal grandmother; Hypertension in his maternal grandfather, maternal grandmother, mother, paternal grandfather, and paternal grandmother; Renal Disease in his paternal grandmother; Sarcoidosis in his maternal grandfather.  ROS:   Review of Systems  Constitution: Negative for decreased appetite, fever and weight gain.  HENT: Negative for congestion, ear discharge, hoarse voice and sore throat.   Eyes: Negative for discharge, redness, vision loss in right eye and visual halos.  Cardiovascular: Negative for chest pain, dyspnea on exertion, leg swelling, orthopnea and palpitations.  Respiratory: Negative for cough, hemoptysis, shortness of breath and snoring.   Endocrine: Negative for heat intolerance and polyphagia.  Hematologic/Lymphatic: Negative for bleeding problem. Does not bruise/bleed easily.  Skin: Negative for flushing, nail changes, rash and suspicious lesions.  Musculoskeletal: Negative for arthritis, joint pain, muscle cramps, myalgias, neck pain and stiffness.  Gastrointestinal: Negative for abdominal pain, bowel incontinence, diarrhea and excessive appetite.  Genitourinary: Negative for decreased libido, genital sores and incomplete emptying.  Neurological: Negative for brief paralysis, focal  weakness, headaches and loss of balance.  Psychiatric/Behavioral: Negative for altered mental status, depression and suicidal ideas.  Allergic/Immunologic: Negative for HIV exposure and persistent infections.    EKGs/Labs/Other Studies Reviewed:    The following studies were reviewed today:   EKG:  The ekg ordered today demonstrates sinus rhythm, heart  rate 92 bpm.  TTE 2/172023 MPRESSIONS     1. Left ventricular ejection fraction, by estimation, is 60 to 65%. The  left ventricle has normal function. The left ventricle has no regional  wall motion abnormalities. There is moderate left ventricular hypertrophy.  Left ventricular diastolic  parameters were normal.   2. Right ventricular systolic function is normal. The right ventricular  size is normal. Tricuspid regurgitation signal is inadequate for assessing  PA pressure.   3. The mitral valve is grossly normal. No evidence of mitral valve  regurgitation.   4. The aortic valve is normal in structure. Aortic valve regurgitation is  not visualized.   5. The inferior vena cava is normal in size with greater than 50%  respiratory variability, suggesting right atrial pressure of 3 mmHg.   Comparison(s): No significant change from prior study.   Conclusion(s)/Recommendation(s): Normal biventricular function without  evidence of hemodynamically significant valvular heart disease.   FINDINGS   Left Ventricle: Left ventricular ejection fraction, by estimation, is 60  to 65%. The left ventricle has normal function. The left ventricle has no  regional wall motion abnormalities. The left ventricular internal cavity  size was normal in size. There is   moderate left ventricular hypertrophy. Left ventricular diastolic  parameters were normal.   Right Ventricle: The right ventricular size is normal. No increase in  right ventricular wall thickness. Right ventricular systolic function is  normal. Tricuspid regurgitation signal is inadequate for assessing PA  pressure.   Left Atrium: Left atrial size was normal in size.   Right Atrium: Right atrial size was normal in size.   Pericardium: There is no evidence of pericardial effusion.   Mitral Valve: The mitral valve is grossly normal. No evidence of mitral  valve regurgitation.   Tricuspid Valve: The tricuspid valve is grossly normal.  Tricuspid valve  regurgitation is not demonstrated.   Aortic Valve: The aortic valve is normal in structure. Aortic valve  regurgitation is not visualized.   Pulmonic Valve: The pulmonic valve was grossly normal. Pulmonic valve  regurgitation is not visualized.   Aorta: The aortic root and ascending aorta are structurally normal, with  no evidence of dilitation.   Venous: The inferior vena cava is normal in size with greater than 50%  respiratory variability, suggesting right atrial pressure of 3 mmHg.   IAS/Shunts: No atrial level shunt detected by color flow Doppler.       Recent Labs: No results found for requested labs within last 365 days.  Recent Lipid Panel    Component Value Date/Time   CHOL 238 (H) 08/13/2019 1311   TRIG 143 08/13/2019 1311   HDL 36 (L) 08/13/2019 1311   CHOLHDL 6.6 (H) 08/13/2019 1311   LDLCALC 176 (H) 08/13/2019 1311    Physical Exam:    VS:  BP (!) 154/100   Pulse 92   Ht 6\' 1"  (1.854 m)   Wt (!) 168.7 kg   SpO2 96%   BMI 49.08 kg/m     Wt Readings from Last 3 Encounters:  09/08/22 (!) 168.7 kg  09/01/21 (!) 167.8 kg  07/26/21 (!) 169.2 kg     GEN: Well nourished,  well developed in no acute distress HEENT: Normal NECK: No JVD; No carotid bruits LYMPHATICS: No lymphadenopathy CARDIAC: S1S2 noted,RRR, no murmurs, rubs, gallops RESPIRATORY:  Clear to auscultation without rales, wheezing or rhonchi  ABDOMEN: Soft, non-tender, non-distended, +bowel sounds, no guarding. EXTREMITIES: No edema, No cyanosis, no clubbing MUSCULOSKELETAL:  No deformity  SKIN: Warm and dry NEUROLOGIC:  Alert and oriented x 3, non-focal PSYCHIATRIC:  Normal affect, good insight  ASSESSMENT:    1. Accelerated hypertension   2. Morbid obesity ([place])   3. Screening for diabetes mellitus (DM)   4. OSA (obstructive sleep apnea)   5. Tobacco use    PLAN:    He is hypertensive today, we need to get him back on his prescription of amlodipine 10 mg daily-he  has opted for the [place] will send his medication today. One of my goals is to start the patient back on his antihypertensives and then adjust medications as appropriate.  He tells me that he started back on his sleep apnea treatment with his CPAP.  He really is interested in losing weight and can refer the patient to our pharmacy team for weight loss treatment.  The patient understands the need to lose weight with diet and exercise. We have discussed specific strategies for this.  Smoking cessation advised.  The patient was counseled on tobacco cessation today for 5 minutes.  Counseling included reviewing the risks of smoking tobacco products, how it impacts the patient's current medical diagnoses and different strategies for quitting.  Pharmacotherapy to aid in tobacco cessation was not prescribed today. The patient coordinate with  primary care provider.  The patient was also advised to call   1-800-QUIT-NOW (651)126-8880) for additional help with quitting smoking.  The patient is in agreement with the above plan. The patient left the office in stable condition.  The patient will follow up in   Medication Adjustments/Labs and Tests Ordered: Current medicines are reviewed at length with the patient today.  Concerns regarding medicines are outlined above.  Orders Placed This Encounter  Procedures   Comprehensive Metabolic Panel (CMET)   Magnesium   Lipid panel   Hemoglobin A1c   AMB Referral to [facility] Pharm[facility]   EKG 12-Lead   Meds ordered this encounter  Medications   amLODipine (NORVASC) 10 MG tablet    Sig: Take 1 tablet (10 mg total) by mouth daily.    Dispense:  90 tablet    Refill:  3    Patient Instructions  Medication Instructions:  Your physician has recommended you make the following change in your medication:  START: Amlodipine 10 mg once daily *If you need a refill on your cardiac medications before your next appointment, please call your  pharmacy*   Lab Work: Your physician recommends that you have labs drawn: TODAY: CMET, Mag, Lipids, HgbA1c If you have labs (blood work) drawn today and your tests are completely normal, you will receive your results only by: Martha (if you have MyChart) OR A paper copy in the mail If you have any lab test that is abnormal or we need to change your treatment, we will call you to review the results.   Testing/Procedures: None   Follow-Up: At [facility], you and your health needs are our priority.  As part of our continuing mission to provide you with exceptional heart care, we have created designated Provider Care Teams.  These Care Teams include your primary Cardiologist (physician) and Advanced Practice Providers (APPs -  Physician Assistants  and Nurse Practitioners) who all work together to provide you with the care you need, when you need it.  We recommend signing up for the patient portal called "MyChart".  Sign up information is provided on this After Visit Summary.  MyChart is used to connect with patients for Virtual Visits (Telemedicine).  Patients are able to view lab/test results, encounter notes, upcoming appointments, etc.  Non-urgent messages can be sent to your provider as well.   To learn more about what you can do with MyChart, go to [email or web address].    Your next appointment:   4 month(s)  Provider:   Berniece Salines, DO   Other Instructions [email or web address]  1-800-QUIT-NOW [phone])  Steps to Quit Smoking Smoking tobacco is the leading cause of preventable death. It can affect almost every organ in the body. Smoking puts you and people around you at risk for many serious, long-lasting (chronic) diseases. Quitting smoking can be hard, but it is one of the best things that you can do for your health. It is never too late to quit. Do not give up if you cannot quit the  first time. Some people need to try many times to quit. Do your best to stick to your quit plan, and talk with your doctor if you have any questions or concerns. How do I get ready to quit? Pick a date to quit. Set a date within the next 2 weeks to give you time to prepare. Write down the reasons why you are quitting. Keep this list in places where you will see it often. Tell your family, friends, and co-workers that you are quitting. Their support is important. Talk with your doctor about the choices that may help you quit. Find out if your health insurance will pay for these treatments. Know the people, places, things, and activities that make you want to smoke (triggers). Avoid them. What first steps can I take to quit smoking? Throw away all cigarettes at home, at work, and in your car. Throw away the things that you use when you smoke, such as ashtrays and lighters. Clean your car. Empty the ashtray. Clean your home, including curtains and carpets. What can I do to help me quit smoking? Talk with your doctor about taking medicines and seeing a counselor. You are more likely to succeed when you do both. If you are pregnant or breastfeeding: Talk with your doctor about counseling or other ways to quit smoking. Do not take medicine to help you quit smoking unless your doctor tells you to. Quit right away Quit smoking completely, instead of slowly cutting back on how much you smoke over a period of time. Stopping smoking right away may be more successful than slowly quitting. Go to counseling. In-person is best if this is an option. You are more likely to quit if you go to counseling sessions regularly. Take medicine You may take medicines to help you quit. Some medicines need a prescription, and some you can buy over-the-counter. Some medicines may contain a drug called nicotine to replace the nicotine in cigarettes. Medicines may: Help you stop having the desire to smoke (cravings). Help  to stop the problems that come when you stop smoking (withdrawal symptoms). Your doctor may ask you to use: Nicotine patches, gum, or lozenges. Nicotine inhalers or sprays. Non-nicotine medicine that you take by mouth. Find resources Find resources and other ways to help you quit smoking and remain smoke-free after you quit. They include: Online chats with a Social worker. Phone quitlines.  Printed Furniture conservator/restorer. Support groups or group counseling. Text messaging programs. Mobile phone apps. Use apps on your mobile phone or tablet that can help you stick to your quit plan. Examples of free services include Quit Guide from the CDC and smokefree.gov  What can I do to make it easier to quit?  Talk to your family and friends. Ask them to support and encourage you. Call a phone quitline, such as 1-800-QUIT-NOW, reach out to support groups, or work with a Social worker. Ask people who smoke to not smoke around you. Avoid places that make you want to smoke, such as: Bars. Parties. Smoke-break areas at work. Spend time with people who do not smoke. Lower the stress in your life. Stress can make you want to smoke. Try these things to lower stress: Getting regular exercise. Doing deep-breathing exercises. Doing yoga. Meditating. What benefits will I see if I quit smoking? Over time, you may have: A better sense of smell and taste. Less coughing and sore throat. A slower heart rate. Lower blood pressure. Clearer skin. Better breathing. Fewer sick days. Summary Quitting smoking can be hard, but it is one of the best things that you can do for your health. Do not give up if you cannot quit the first time. Some people need to try many times to quit. When you decide to quit smoking, make a plan to help you succeed. Quit smoking right away, not slowly over a period of time. When you start quitting, get help and support to keep you smoke-free. This information is not intended to replace  advice given to you by your health care provider. Make sure you discuss any questions you have with your health care provider. Document Revised: 06/03/2021 Document Reviewed: 06/03/2021 Elsevier Patient Education  [place].    Adopting a Healthy Lifestyle.  Know what a healthy weight is for you (roughly BMI <25) and aim to maintain this   Aim for 7+ servings of fruits and vegetables daily   65-80+ fluid ounces of water or unsweet tea for healthy kidneys   Limit to max 1 drink of alcohol per day; avoid smoking/tobacco   Limit animal fats in diet for cholesterol and heart health - choose grass fed whenever available   Avoid highly processed foods, and foods high in saturated/trans fats   Aim for low stress - take time to unwind and care for your mental health   Aim for 150 min of moderate intensity exercise weekly for heart health, and weights twice weekly for bone health   Aim for 7-9 hours of sleep daily   When it comes to diets, agreement about the perfect plan isnt easy to find, even among the experts. Experts at the [employer] developed an idea known as the Healthy Eating Plate. Just imagine a plate divided into logical, healthy portions.   The emphasis is on diet quality:   Load up on vegetables and fruits - one-half of your plate: Aim for color and variety, and remember that potatoes dont count.   Go for whole grains - one-quarter of your plate: Whole wheat, barley, wheat berries, quinoa, oats, brown rice, and foods made with them. If you want pasta, go with whole wheat pasta.   Protein power - one-quarter of your plate: Fish, chicken, beans, and nuts are all healthy, versatile protein sources. Limit red meat.   The diet, however, does go beyond the plate, offering a few other suggestions.   Use healthy plant oils,  such as olive, canola, soy, corn, sunflower and peanut. Check the labels, and avoid partially hydrogenated oil, which have  unhealthy trans fats.   If youre thirsty, drink water. Coffee and tea are good in moderation, but skip sugary drinks and limit milk and dairy products to one or two daily servings.   The type of carbohydrate in the diet is more important than the amount. Some sources of carbohydrates, such as vegetables, fruits, whole grains, and beans-are healthier than others.   Finally, stay active  Signed, Berniece Salines, DO  09/08/2022 9:31 PM    [facility]

## 2022-09-08 NOTE — Patient Instructions (Addendum)
Medication Instructions:  Your physician has recommended you make the following change in your medication:  START: Amlodipine 10 mg once daily *If you need a refill on your cardiac medications before your next appointment, please call your pharmacy*   Lab Work: Your physician recommends that you have labs drawn: TODAY: CMET, Mag, Lipids, HgbA1c If you have labs (blood work) drawn today and your tests are completely normal, you will receive your results only by: [place] (if you have MyChart) OR A paper copy in the mail If you have any lab test that is abnormal or we need to change your treatment, we will call you to review the results.   Testing/Procedures: None   Follow-Up: At [facility], you and your health needs are our priority.  As part of our continuing mission to provide you with exceptional heart care, we have created designated Provider Care Teams.  These Care Teams include your primary Cardiologist (physician) and Advanced Practice Providers (APPs -  Physician Assistants and Nurse Practitioners) who all work together to provide you with the care you need, when you need it.  We recommend signing up for the patient portal called "MyChart".  Sign up information is provided on this After Visit Summary.  MyChart is used to connect with patients for Virtual Visits (Telemedicine).  Patients are able to view lab/test results, encounter notes, upcoming appointments, etc.  Non-urgent messages can be sent to your provider as well.   To learn more about what you can do with MyChart, go to [email or web address].    Your next appointment:   4 month(s)  Provider:   Berniece Salines, DO   Other Instructions [email or web address]  1-800-QUIT-NOW [phone])  Steps to Quit Smoking Smoking tobacco is the leading cause of preventable death. It can affect almost every organ in the body. Smoking puts you and  people around you at risk for many serious, long-lasting (chronic) diseases. Quitting smoking can be hard, but it is one of the best things that you can do for your health. It is never too late to quit. Do not give up if you cannot quit the first time. Some people need to try many times to quit. Do your best to stick to your quit plan, and talk with your doctor if you have any questions or concerns. How do I get ready to quit? Pick a date to quit. Set a date within the next 2 weeks to give you time to prepare. Write down the reasons why you are quitting. Keep this list in places where you will see it often. Tell your family, friends, and co-workers that you are quitting. Their support is important. Talk with your doctor about the choices that may help you quit. Find out if your health insurance will pay for these treatments. Know the people, places, things, and activities that make you want to smoke (triggers). Avoid them. What first steps can I take to quit smoking? Throw away all cigarettes at home, at work, and in your car. Throw away the things that you use when you smoke, such as ashtrays and lighters. Clean your car. Empty the ashtray. Clean your home, including curtains and carpets. What can I do to help me quit smoking? Talk with your doctor about taking medicines and seeing a counselor. You are more likely to succeed when you do both. If you are pregnant or breastfeeding: Talk with your doctor about counseling or other ways to quit smoking. Do not take medicine to help you quit smoking  unless your doctor tells you to. Quit right away Quit smoking completely, instead of slowly cutting back on how much you smoke over a period of time. Stopping smoking right away may be more successful than slowly quitting. Go to counseling. In-person is best if this is an option. You are more likely to quit if you go to counseling sessions regularly. Take medicine You may take medicines to help you  quit. Some medicines need a prescription, and some you can buy over-the-counter. Some medicines may contain a drug called nicotine to replace the nicotine in cigarettes. Medicines may: Help you stop having the desire to smoke (cravings). Help to stop the problems that come when you stop smoking (withdrawal symptoms). Your doctor may ask you to use: Nicotine patches, gum, or lozenges. Nicotine inhalers or sprays. Non-nicotine medicine that you take by mouth. Find resources Find resources and other ways to help you quit smoking and remain smoke-free after you quit. They include: Online chats with a Social worker. Phone quitlines. Printed Furniture conservator/restorer. Support groups or group counseling. Text messaging programs. Mobile phone apps. Use apps on your mobile phone or tablet that can help you stick to your quit plan. Examples of free services include Quit Guide from the CDC and smokefree.gov  What can I do to make it easier to quit?  Talk to your family and friends. Ask them to support and encourage you. Call a phone quitline, such as 1-800-QUIT-NOW, reach out to support groups, or work with a Social worker. Ask people who smoke to not smoke around you. Avoid places that make you want to smoke, such as: Bars. Parties. Smoke-break areas at work. Spend time with people who do not smoke. Lower the stress in your life. Stress can make you want to smoke. Try these things to lower stress: Getting regular exercise. Doing deep-breathing exercises. Doing yoga. Meditating. What benefits will I see if I quit smoking? Over time, you may have: A better sense of smell and taste. Less coughing and sore throat. A slower heart rate. Lower blood pressure. Clearer skin. Better breathing. Fewer sick days. Summary Quitting smoking can be hard, but it is one of the best things that you can do for your health. Do not give up if you cannot quit the first time. Some people need to try many times to  quit. When you decide to quit smoking, make a plan to help you succeed. Quit smoking right away, not slowly over a period of time. When you start quitting, get help and support to keep you smoke-free. This information is not intended to replace advice given to you by your health care provider. Make sure you discuss any questions you have with your health care provider. Document Revised: 06/03/2021 Document Reviewed: 06/03/2021 Elsevier Patient Education  [place].

## 2022-09-09 LAB — COMPREHENSIVE METABOLIC PANEL
ALT: 26 IU/L (ref 0–44)
AST: 23 IU/L (ref 0–40)
Albumin/Globulin Ratio: 1.5 (ref 1.2–2.2)
Albumin: 4.4 g/dL (ref 4.1–5.1)
Alkaline Phosphatase: 73 IU/L (ref 44–121)
BUN/Creatinine Ratio: 10 (ref 9–20)
BUN: 10 mg/dL (ref 6–20)
Bilirubin Total: 0.6 mg/dL (ref 0.0–1.2)
CO2: 22 mmol/L (ref 20–29)
Calcium: 9.1 mg/dL (ref 8.7–10.2)
Chloride: 102 mmol/L (ref 96–106)
Creatinine, Ser: 1.03 mg/dL (ref 0.76–1.27)
Globulin, Total: 2.9 g/dL (ref 1.5–4.5)
Glucose: 84 mg/dL (ref 70–99)
Potassium: 4.5 mmol/L (ref 3.5–5.2)
Sodium: 139 mmol/L (ref 134–144)
Total Protein: 7.3 g/dL (ref 6.0–8.5)
eGFR: 97 mL/min/{1.73_m2} (ref >59)

## 2022-09-09 LAB — LIPID PANEL
Chol/HDL Ratio: 6.1 ratio — ABNORMAL HIGH (ref 0.0–5.0)
Cholesterol, Total: 226 mg/dL — ABNORMAL HIGH (ref 100–199)
HDL: 37 mg/dL — ABNORMAL LOW (ref >39)
LDL Chol Calc (NIH): 158 mg/dL — ABNORMAL HIGH (ref 0–99)
Triglycerides: 171 mg/dL — ABNORMAL HIGH (ref 0–149)
VLDL Cholesterol Cal: 31 mg/dL (ref 5–40)

## 2022-09-09 LAB — HEMOGLOBIN A1C
Est. average glucose Bld gHb Est-mCnc: 123 mg/dL
Hgb A1c MFr Bld: 5.9 % — ABNORMAL HIGH (ref 4.8–5.6)

## 2022-09-09 LAB — MAGNESIUM: Magnesium: 2.2 mg/dL (ref 1.6–2.3)

## 2022-09-12 ENCOUNTER — Other Ambulatory Visit: Payer: Self-pay

## 2022-09-12 ENCOUNTER — Other Ambulatory Visit (HOSPITAL_COMMUNITY): Payer: Self-pay

## 2022-09-12 MED ORDER — ROSUVASTATIN CALCIUM 10 MG PO TABS
10.0000 mg | ORAL_TABLET | Freq: Every day | ORAL | 3 refills | Status: AC
  Filled 2022-09-12: qty 90, 90d supply, fill #0

## 2022-10-11 ENCOUNTER — Ambulatory Visit: Payer: BC Managed Care – PPO

## 2023-01-10 ENCOUNTER — Ambulatory Visit: Payer: BC Managed Care – PPO | Attending: Cardiology | Admitting: Cardiology

## 2023-03-06 ENCOUNTER — Other Ambulatory Visit (HOSPITAL_COMMUNITY): Payer: Self-pay

## 2023-03-06 MED ORDER — CYCLOBENZAPRINE HCL 10 MG PO TABS
10.0000 mg | ORAL_TABLET | Freq: Three times a day (TID) | ORAL | 0 refills | Status: AC
  Filled 2023-03-06: qty 60, 20d supply, fill #0

## 2023-03-06 MED ORDER — PREDNISONE 10 MG PO TABS
ORAL_TABLET | ORAL | 0 refills | Status: AC
  Filled 2023-03-06: qty 42, 12d supply, fill #0

## 2023-03-06 MED ORDER — MELOXICAM 15 MG PO TABS
15.0000 mg | ORAL_TABLET | Freq: Every day | ORAL | 2 refills | Status: AC
  Filled 2023-03-06: qty 30, 30d supply, fill #0
